# Patient Record
Sex: Female | Born: 1945 | Race: White | Hispanic: No | Smoking: Former smoker
Health system: Southern US, Community
[De-identification: ages and names within clinical notes are randomized; demographics above are authoritative.]

## PROBLEM LIST (undated history)

## (undated) DIAGNOSIS — H409 Unspecified glaucoma: Secondary | ICD-10-CM

## (undated) DIAGNOSIS — M1990 Unspecified osteoarthritis, unspecified site: Secondary | ICD-10-CM

## (undated) DIAGNOSIS — K589 Irritable bowel syndrome without diarrhea: Secondary | ICD-10-CM

## (undated) DIAGNOSIS — D649 Anemia, unspecified: Secondary | ICD-10-CM

## (undated) DIAGNOSIS — F419 Anxiety disorder, unspecified: Secondary | ICD-10-CM

## (undated) DIAGNOSIS — E785 Hyperlipidemia, unspecified: Secondary | ICD-10-CM

## (undated) DIAGNOSIS — F32A Depression, unspecified: Secondary | ICD-10-CM

## (undated) DIAGNOSIS — J189 Pneumonia, unspecified organism: Secondary | ICD-10-CM

## (undated) DIAGNOSIS — H269 Unspecified cataract: Secondary | ICD-10-CM

## (undated) DIAGNOSIS — E039 Hypothyroidism, unspecified: Secondary | ICD-10-CM

## (undated) DIAGNOSIS — I10 Essential (primary) hypertension: Secondary | ICD-10-CM

## (undated) DIAGNOSIS — K219 Gastro-esophageal reflux disease without esophagitis: Secondary | ICD-10-CM

## (undated) HISTORY — DX: Irritable bowel syndrome without diarrhea: K58.9

## (undated) HISTORY — DX: Pneumonia, unspecified organism: J18.9

## (undated) HISTORY — DX: Unspecified osteoarthritis, unspecified site: M19.90

## (undated) HISTORY — DX: Unspecified cataract: H26.9

## (undated) HISTORY — PX: APPENDECTOMY: SHX54

## (undated) HISTORY — PX: BREAST BIOPSY: SHX20

## (undated) HISTORY — PX: TONSILLECTOMY: SUR1361

## (undated) HISTORY — DX: Depression, unspecified: F32.A

## (undated) HISTORY — DX: Anemia, unspecified: D64.9

## (undated) HISTORY — DX: Gastro-esophageal reflux disease without esophagitis: K21.9

## (undated) HISTORY — DX: Hypothyroidism, unspecified: E03.9

## (undated) HISTORY — DX: Unspecified glaucoma: H40.9

## (undated) HISTORY — DX: Essential (primary) hypertension: I10

## (undated) HISTORY — DX: Anxiety disorder, unspecified: F41.9

## (undated) HISTORY — DX: Hyperlipidemia, unspecified: E78.5

---

## 1977-03-09 HISTORY — PX: TUBAL LIGATION: SHX77

## 1997-09-05 ENCOUNTER — Other Ambulatory Visit: Admission: RE | Admit: 1997-09-05 | Discharge: 1997-09-05 | Payer: Self-pay | Admitting: Obstetrics and Gynecology

## 1998-09-06 ENCOUNTER — Other Ambulatory Visit: Admission: RE | Admit: 1998-09-06 | Discharge: 1998-09-06 | Payer: Self-pay | Admitting: Obstetrics and Gynecology

## 1999-09-08 ENCOUNTER — Other Ambulatory Visit: Admission: RE | Admit: 1999-09-08 | Discharge: 1999-09-08 | Payer: Self-pay | Admitting: *Deleted

## 1999-11-21 ENCOUNTER — Encounter: Admission: RE | Admit: 1999-11-21 | Discharge: 1999-11-21 | Payer: Self-pay | Admitting: *Deleted

## 2000-03-12 ENCOUNTER — Encounter (INDEPENDENT_AMBULATORY_CARE_PROVIDER_SITE_OTHER): Payer: Self-pay | Admitting: Specialist

## 2000-03-12 ENCOUNTER — Encounter: Payer: Self-pay | Admitting: Surgery

## 2000-03-12 ENCOUNTER — Inpatient Hospital Stay (HOSPITAL_COMMUNITY): Admission: EM | Admit: 2000-03-12 | Discharge: 2000-03-14 | Payer: Self-pay | Admitting: Emergency Medicine

## 2000-09-20 ENCOUNTER — Other Ambulatory Visit: Admission: RE | Admit: 2000-09-20 | Discharge: 2000-09-20 | Payer: Self-pay | Admitting: Obstetrics and Gynecology

## 2000-11-22 ENCOUNTER — Encounter: Admission: RE | Admit: 2000-11-22 | Discharge: 2000-11-22 | Payer: Self-pay | Admitting: *Deleted

## 2001-08-24 ENCOUNTER — Ambulatory Visit (HOSPITAL_COMMUNITY): Admission: RE | Admit: 2001-08-24 | Discharge: 2001-08-24 | Payer: Self-pay | Admitting: Gastroenterology

## 2001-08-24 ENCOUNTER — Encounter (INDEPENDENT_AMBULATORY_CARE_PROVIDER_SITE_OTHER): Payer: Self-pay | Admitting: *Deleted

## 2001-09-26 ENCOUNTER — Other Ambulatory Visit: Admission: RE | Admit: 2001-09-26 | Discharge: 2001-09-26 | Payer: Self-pay | Admitting: Obstetrics and Gynecology

## 2001-11-28 ENCOUNTER — Encounter: Admission: RE | Admit: 2001-11-28 | Discharge: 2001-11-28 | Payer: Self-pay | Admitting: *Deleted

## 2002-10-03 ENCOUNTER — Other Ambulatory Visit: Admission: RE | Admit: 2002-10-03 | Discharge: 2002-10-03 | Payer: Self-pay | Admitting: Obstetrics and Gynecology

## 2002-11-30 ENCOUNTER — Encounter: Admission: RE | Admit: 2002-11-30 | Discharge: 2002-11-30 | Payer: Self-pay | Admitting: *Deleted

## 2003-12-03 ENCOUNTER — Encounter: Admission: RE | Admit: 2003-12-03 | Discharge: 2003-12-03 | Payer: Self-pay | Admitting: *Deleted

## 2004-12-04 ENCOUNTER — Encounter: Admission: RE | Admit: 2004-12-04 | Discharge: 2004-12-04 | Payer: Self-pay | Admitting: *Deleted

## 2006-01-15 ENCOUNTER — Encounter: Admission: RE | Admit: 2006-01-15 | Discharge: 2006-01-15 | Payer: Self-pay | Admitting: *Deleted

## 2006-01-26 ENCOUNTER — Encounter: Admission: RE | Admit: 2006-01-26 | Discharge: 2006-01-26 | Payer: Self-pay | Admitting: *Deleted

## 2007-01-28 ENCOUNTER — Encounter: Admission: RE | Admit: 2007-01-28 | Discharge: 2007-01-28 | Payer: Self-pay | Admitting: Obstetrics and Gynecology

## 2008-02-10 ENCOUNTER — Encounter: Admission: RE | Admit: 2008-02-10 | Discharge: 2008-02-10 | Payer: Self-pay | Admitting: Obstetrics and Gynecology

## 2009-03-14 ENCOUNTER — Encounter: Admission: RE | Admit: 2009-03-14 | Discharge: 2009-03-14 | Payer: Self-pay | Admitting: Obstetrics and Gynecology

## 2010-03-28 ENCOUNTER — Encounter
Admission: RE | Admit: 2010-03-28 | Discharge: 2010-03-28 | Payer: Self-pay | Source: Home / Self Care | Attending: Obstetrics and Gynecology | Admitting: Obstetrics and Gynecology

## 2010-04-09 ENCOUNTER — Inpatient Hospital Stay (HOSPITAL_COMMUNITY): Payer: BC Managed Care – PPO

## 2010-04-09 ENCOUNTER — Inpatient Hospital Stay (HOSPITAL_COMMUNITY)
Admission: AD | Admit: 2010-04-09 | Discharge: 2010-04-11 | DRG: 090 | Disposition: A | Discharge status: Home / Self Care | Payer: BC Managed Care – PPO | Source: Ambulatory Visit | Attending: Internal Medicine | Admitting: Internal Medicine

## 2010-04-09 DIAGNOSIS — M949 Disorder of cartilage, unspecified: Secondary | ICD-10-CM | POA: Diagnosis present

## 2010-04-09 DIAGNOSIS — F17200 Nicotine dependence, unspecified, uncomplicated: Secondary | ICD-10-CM | POA: Diagnosis present

## 2010-04-09 DIAGNOSIS — J45901 Unspecified asthma with (acute) exacerbation: Secondary | ICD-10-CM | POA: Diagnosis present

## 2010-04-09 DIAGNOSIS — J189 Pneumonia, unspecified organism: Principal | ICD-10-CM | POA: Diagnosis present

## 2010-04-09 DIAGNOSIS — M899 Disorder of bone, unspecified: Secondary | ICD-10-CM | POA: Diagnosis present

## 2010-04-09 DIAGNOSIS — E785 Hyperlipidemia, unspecified: Secondary | ICD-10-CM | POA: Diagnosis present

## 2010-04-09 LAB — BASIC METABOLIC PANEL
BUN: 9 mg/dL (ref 6–23)
CO2: 29 mEq/L (ref 19–32)
Calcium: 9.5 mg/dL (ref 8.4–10.5)
GFR calc Af Amer: >60 mL/min (ref >60)
GFR calc non Af Amer: >60 mL/min (ref >60)
Glucose, Bld: 117 mg/dL — ABNORMAL HIGH (ref 70–99)

## 2010-04-09 LAB — D-DIMER, QUANTITATIVE: D-Dimer, Quant: 1.27 ug/mL-FEU — ABNORMAL HIGH (ref 0.00–0.48)

## 2010-04-10 MED ORDER — IOHEXOL 300 MG/ML  SOLN
100.0000 mL | Freq: Once | INTRAMUSCULAR | Status: AC | PRN
  Administered 2010-04-10: 80 mL via INTRAVENOUS
  Administered 2010-04-10: 100 mL via INTRAVENOUS
  Filled 2010-04-10: qty 100

## 2010-04-12 NOTE — Discharge Summary (Signed)
Sarah Baldwin, Sarah Baldwin NO.:  [id number]  MEDICAL RECORD NO.:  [id number]           PATIENT TYPE:  I  LOCATION:  1425                         FACILITY:  [facility]  PHYSICIAN:  Geoffry Paradise, M.D.  DATE OF BIRTH:  Jun 11, 1945  DATE OF ADMISSION:  04/09/2010 DATE OF DISCHARGE:  04/11/2010                              DISCHARGE SUMMARY   SLATED DATE OF DISCHARGE:  February 3.  DIAGNOSES:  At the time of discharge: 1. Probable right lower lobe pneumonia, community-acquired. 2. Exacerbation of asthmatic bronchitis. 3. Tobacco dependency. 4. Hyperlipidemia.  HISTORY OF PRESENT ILLNESS:  Sarah Baldwin is a very pleasant 65 year old, well-known to myself, who does utilize tobacco on a daily basis and for the most part has been healthy.  The only active issue has been some hyperlipidemia.  She presented with several days of cold symptoms but suddenly awakening in the early morning hours with smothering and shortness of breath.  Subsequently, she was seen in our office where she demonstrated significant amount of air hunger and accessory muscle utilization.  Her O2 saturations at that point were in the mid 80% range.  Chest radiograph was basically unrevealing, but exam was very suggestive of a right lower lobe pneumonia clinically and this in conjunction with her hypoxemia and work of breathing led to admission for further management as she was not deemed to be safe in an outpatient setting.  ALLERGIES:  No known drug allergies.  MEDICATIONS:  Upon admission included: 1. Pravastatin 20 mg daily. 2. Fish oil 1000 b.i.d.  MEDICAL ILLNESSES:  Included: 1. Hyperlipidemia. 2. Osteopenia. 3. Acute bronchitis. 4. Sinusitis. 5. Tobacco utilization. 6. Granulomatous colitis.  SURGICAL ILLNESSES:  Include: 1. Appendectomy in 2002. 2. Tubal ligation in 1979.  FAMILY HISTORY:  Remarkable for cerebrovascular disease, diabetes, hypertension.  SOCIAL HISTORY:  The  patient is widowed, retired Dipl[employer] Services operational officer.  Smokes less than a pack of cigarettes per day.  Rare alcohol.  PHYSICAL EXAMINATION:  VITAL SIGNS:  Upon admission, temperature was 101.7, blood pressure was 152/105, pulse was 130 and regular, O2 saturation was 89% room air.  Weight was 119 pounds. GENERAL:  The patient was toxic appearing, dyspneic. HEENT:  Good facial symmetry.  Anicteric.  Oropharynx is benign. NECK:  No JVD or bruits. LUNGS:  Prolonged expiratory phase.  Mild wheezing bilaterally.  Right lower lung field rales one-third the way up, left otherwise clear. CARDIOVASCULAR:  Tachycardic.  No murmur, gallop, rub or heave. ABDOMEN:  Soft, nontender.  Bowel sounds normal. EXTREMITIES:  No cyanosis, clubbing or edema.  No mottling.  Warm distally.  Intact pulses. NEUROLOGIC:  She was nonlateralizing.  DIAGNOSTIC STUDIES:  Chest x-ray initially was negative, this was done in our office setting.  CT angio done revealed no pulmonary embolus, basilar subsegmental vessels not evaluated because of respiratory motion emphysema, small amount of anterior pericardial fluid or thickening.  LABORATORY DATA:  Laboratory work attached to the chart and done in our office.  CBC; hemoglobin 15.8, hematocrit 48.9, white blood count 6.3, platelet count 208,000.  Chemistry; sodium 105, BUN 12, creatinine 0.7, GFR was 90.1, sodium 138, potassium 4.1, chloride 104, CO2 was 27.  Calcium was 10.1, protein 7.7, albumin 4.1, SGOT 21, SGPT 23, alk phos 96, bilirubin was 0.2.  D-dimer was high at 1.27.  BMET; sodium 140, potassium 4.3, chloride 102, CO2 was 29, glucose 117, BUN is 9, creatinine 0.7.  HOSPITAL COURSE:  The patient was admitted, placed on IV fluids.  She was started on Rocephin IV and Zithromax 500 daily.  We subsequently added Solu-Medrol to address her bronchospasm and NicoDerm patch to help with nicotine wean.  She improved fairly dramatically to the extent that respiratory status on the  morning of this dictation is back to baseline. She is afebrile and blood pressure has remained stable following settling down of her work of breathing with the systolics ranging 123- 135 and diastolics in the 70 range.  Her O2 saturations have been in the 97% range on 2 L, and oxygen has been weaned.  She has been ambulating without difficulty and transitioned to oral medications at the time of this discharge.  Should she continue to improve through the day, she will be discharged for further as an outpatient.  Despite the negative radiographic findings, clinically this still remains compatible with a right lower lobe pneumonia.  We will not repeat to reconfirm following hydration and just treat empirically.  DISCHARGE MEDICATIONS:  Include: 1. Acetaminophen 325, 650 every 4 hours p.r.n. 2. NicoDerm patch 14 mg per 24 hours applied topically daily for 1     month. 3. Avelox 400 mg daily for 7 days. 4. ProAir Inhaler 2 puffs every 4 hours as needed for wheezing or     shortness of breath. 5. Continuation of her home fish oral, vitamin D and Pravachol. 6. Additionally, she is on a prednisone taper 10 mg q.i.d. 3 days,     t.i.d. 3 days, b.i.d. 3 days, daily 3 days, then discontinuation.  She will follow up with Dr. Jacky Kindle in approximately 7-10 days.  She was counseled and educated on strict tobacco abstinence.  She is tolerating a regular diet and has no special needs.  She will be handled by her daughter, who is coming to town and spending the weekend and with her.          ______________________________ Geoffry Paradise, M.D.     RA/MEDQ  D:  04/11/2010  T:  04/11/2010  Job:  [id number]  Electronically Signed by Geoffry Paradise M.D. on 04/12/2010 01:15:09 PM

## 2010-07-25 NOTE — H&P (Signed)
[facility]  Patient:    Sarah Baldwin, Sarah Baldwin                MRN: [id number] Adm. Date:  03/12/00 Attending:  Velora Heckler, M.D. CC:         Richard A. Jacky Kindle, M.D.   History and Physical  ADMISSION DIAGNOSIS:  Acute appendicitis.  BRIEF HISTORY:  The patient is a 65 year old white female who presents to the emergency department with a 48-hour history of generalized abdominal discomfort which is now localized to the right lower quadrant.  The patient initially had crampy abdominal pain.  She now has more persistent pain in the right lower quadrant.  She is unable to stand erect.  She has pain with walking and with riding in a car.  She has had generalized loss of appetite and has only taken a little bit of fluid and two soda crackers today.  The patient was seen by her primary care physician, Richard A. Jacky Kindle, M.D., and referred to the emergency department with a presumptive diagnosis of acute appendicitis.  The patient was seen and evaluated in the emergency department. She was noted to have an elevated white blood cell count of 11,000 with a slight left shift.  She is afebrile.  Abdominal x-ray was unrevealing.  Other laboratory studies and urinalysis were benign.  PAST MEDICAL HISTORY:  Status post bilateral tubal ligation in 1980.  MEDICATIONS:  Prempro.  ALLERGIES:  None known.  SOCIAL HISTORY:  The patient is the Biochemist, clinical at [employer].  She smokes less than a pack of cigarettes a day.  She drinks alcohol on occasion.  REVIEW OF SYSTEMS:  The patient has had a recent history of cardiac palpitations.  A complete work-up has not been obtained.  Otherwise the review of systems is unremarkable.  PHYSICAL EXAMINATION:  GENERAL APPEARANCE:  A 65 year old, well-nourished, well-developed, white female on a stretcher in the emergency department.  VITAL SIGNS:  Temperature 97.6 degrees, pulse 91,  respirations 24, blood pressure 121/80.  HEENT:  Normocephalic.  The sclerae are clear.  The mucous membranes are moist.  Dentition is good.  NECK:  Supple without masses.  CHEST:  Clear to auscultation bilaterally.  There is no costovertebral angle tenderness.  CARDIAC:  Regular rate and rhythm.  ABDOMEN:  Soft.  There are bowel sounds present.  There is a surgical wound at the umbilicus.  There is mild tenderness to percussion in the suprapubic area. There is moderate tenderness to percussion in the right lower quadrant at approximately McBurneys point.  There is tenderness to palpation in the right lower quadrant at McBurneys point.  There is rebound tenderness in the right lower quadrant.  There is referred rebound tenderness in the left lower quadrant.  EXTREMITIES:  Nontender without edema.  NEUROLOGIC:  The patient is alert and oriented to person, place, and time without focal neurologic deficits.  LABORATORY STUDIES:  The complete blood count shows a white blood cell count of 11,000, the hemoglobin is 14.8, hematocrit 42.4%, platelet count 225,000, and the differential shows 75% neutrophils and 18% lymphocytes.  The urinalysis is benign.  Her pro time is normal at 12.8 with an INR of 1.0.  The chemistry profile is pending at the time of dictation.  Abdominal x-ray with no acute intra-abdominal findings.  EKG with normal sinus rhythm without acute findings.  Chest x-ray with prominent right epicardial fat pad, otherwise no active disease.  IMPRESSION:  Probable acute appendicitis.  I  discussed at length with the patient and her friend the indications for surgery.  I offered diagnostic laparoscopy and possible laparoscopic appendectomy versus observation and CT scan of the abdomen.  The patient would prefer the surgical route.  I quoted them approximately an 80% chance that the appendix could be removed laparoscopically and an approximately 20% chance of conversion to  an open procedure.  The understand and wish to proceed.  PLAN: 1. Admit to [facility]. 2. Initiation of intravenous antibiotics. 3. To operating room for appendectomy. 4. Routine postoperative care. DD:  03/12/00 TD:  03/12/00 Job: [id number]

## 2010-07-25 NOTE — Op Note (Signed)
[facility]  Patient:    Sarah Baldwin, Sarah Baldwin                 MRN: [id number] Proc. Date: 03/12/00 Adm. Date:  [id number] Attending:  Bonnetta Barry CC:         Richard A. Jacky Kindle, M.D.   Operative Report  PREOPERATIVE DIAGNOSIS:  Acute appendicitis.  POSTOPERATIVE DIAGNOSIS:  Acute appendicitis.  OPERATION:  Laparoscopic appendectomy.  SURGEON:  Velora Heckler, M.D.  ANESTHESIA:  General.  ESTIMATED BLOOD LOSS:  Minimal.  PREPARATION:  Betadine.  COMPLICATIONS:  None.  INDICATIONS:  The patient is a 65 year old white female who presents to the emergency room with a 48-hour history of abdominal pain, loss of appetite, and pain localized to the right lower quadrant.  Laboratory studies show an elevated white blood cell count of 11,000 with 75% segmented neutrophil. Physical examination is consistent with acute appendicitis.  The patient was brought to the operating room for diagnostic laparoscopy and laparoscopic appendectomy.  DESCRIPTION OF PROCEDURE:  The procedure is done in OR #1 at the [facility]  The patient is brought to the operating room and placed in the supine position on the operating room table.  Following administration of general endotracheal anesthetic, the patient was prepped and draped in the usual strict aseptic fashion.  After ascertaining an adequate level of anesthesia had been obtained, an infraumbilical incision was made with a #15 blade.  Dissection was carried down to subcutaneous tissues.  The fascia was incised in the midline and the peritoneal cavity was entered cautiously.  An 0 Vicryl pursestring suture was placed in the fascia.  A Hasson cannula was introduced under direct vision and secured with a pursestring suture.  The abdomen was insufflated with carbon dioxide.  The laparoscope was introduced under direct vision, and the abdomen explored.  There are adhesions of the omentum to  the right lower quadrant.  The cecum and base of the appendix are visible.  Operative ports were placed in the right upper quadrant and left lower quadrant.  The base of the appendix was elevated and a window is created.  The base of the appendix was transected with a GIA type stapler. The appendix is acutely inflamed distally with omental adhesions densely wrapped around the tip of the appendix.  Gentle dissection with instruments allows for separation and the appendiceal mesentery is identified.  This is transected with a GIA type stapler.  The remaining adhesions to the tip of the appendix were taken down with the electrocautery and the Metzenbaum scissors.  The appendix is completely mobilized and placed into an EndoCatch bag and withdrawn through the left lower quadrant port without difficulty.  It is submitted to pathology for review.  The right lower quadrant is copiously irrigated with warm saline.  Good hemostasis is noted.  Fluid is evacuated. Ports are removed under direct vision and pneumoperitoneum released.  The 0 Vicryl pursestring suture is tied securely.  Port sites are anesthetized with local anesthetic.  All three wounds are closed with interrupted 4-0 Vicryl and subcuticular sutures.  The wounds are washed and dried and benzoin and Steri-Strips were applied.  A sterile gauze dressings are applied.  The patient is transferred to the recovery room in stable condition.  The patient tolerated the procedure well. DD:  03/12/00 TD:  03/13/00 Job: [id number]

## 2011-03-12 ENCOUNTER — Other Ambulatory Visit: Payer: Self-pay | Admitting: Internal Medicine

## 2011-03-12 DIAGNOSIS — Z1231 Encounter for screening mammogram for malignant neoplasm of breast: Secondary | ICD-10-CM

## 2011-03-31 ENCOUNTER — Ambulatory Visit: Payer: BC Managed Care – PPO

## 2011-04-14 ENCOUNTER — Ambulatory Visit
Admission: RE | Admit: 2011-04-14 | Discharge: 2011-04-14 | Disposition: A | Discharge status: Home / Self Care | Payer: BC Managed Care – PPO | Source: Ambulatory Visit | Attending: Internal Medicine | Admitting: Internal Medicine

## 2011-04-14 DIAGNOSIS — Z1231 Encounter for screening mammogram for malignant neoplasm of breast: Secondary | ICD-10-CM

## 2013-01-11 ENCOUNTER — Ambulatory Visit: Payer: BC Managed Care – PPO

## 2013-04-05 ENCOUNTER — Other Ambulatory Visit: Payer: Self-pay

## 2013-04-05 DIAGNOSIS — Z1231 Encounter for screening mammogram for malignant neoplasm of breast: Secondary | ICD-10-CM

## 2013-04-19 ENCOUNTER — Ambulatory Visit
Admission: RE | Admit: 2013-04-19 | Discharge: 2013-04-19 | Disposition: A | Discharge status: Home / Self Care | Payer: Medicare Other | Source: Ambulatory Visit

## 2013-04-19 DIAGNOSIS — Z1231 Encounter for screening mammogram for malignant neoplasm of breast: Secondary | ICD-10-CM

## 2013-04-21 ENCOUNTER — Other Ambulatory Visit: Payer: Self-pay | Admitting: Internal Medicine

## 2013-04-21 DIAGNOSIS — R928 Other abnormal and inconclusive findings on diagnostic imaging of breast: Secondary | ICD-10-CM

## 2013-05-09 ENCOUNTER — Other Ambulatory Visit: Payer: Self-pay | Admitting: Internal Medicine

## 2013-05-09 ENCOUNTER — Ambulatory Visit
Admission: RE | Admit: 2013-05-09 | Discharge: 2013-05-09 | Disposition: A | Discharge status: Home / Self Care | Payer: Medicare Other | Source: Ambulatory Visit | Attending: Internal Medicine | Admitting: Internal Medicine

## 2013-05-09 DIAGNOSIS — R928 Other abnormal and inconclusive findings on diagnostic imaging of breast: Secondary | ICD-10-CM

## 2013-05-18 ENCOUNTER — Ambulatory Visit
Admission: RE | Admit: 2013-05-18 | Discharge: 2013-05-18 | Disposition: A | Discharge status: Home / Self Care | Payer: Medicare Other | Source: Ambulatory Visit | Attending: Internal Medicine | Admitting: Internal Medicine

## 2013-05-18 ENCOUNTER — Other Ambulatory Visit: Payer: Self-pay | Admitting: Internal Medicine

## 2013-05-18 DIAGNOSIS — R928 Other abnormal and inconclusive findings on diagnostic imaging of breast: Secondary | ICD-10-CM

## 2013-05-18 HISTORY — PX: BREAST BIOPSY: SHX20

## 2013-10-23 ENCOUNTER — Other Ambulatory Visit: Payer: Self-pay | Admitting: Internal Medicine

## 2013-10-23 DIAGNOSIS — N63 Unspecified lump in unspecified breast: Secondary | ICD-10-CM

## 2013-11-21 ENCOUNTER — Ambulatory Visit
Admission: RE | Admit: 2013-11-21 | Discharge: 2013-11-21 | Disposition: A | Discharge status: Home / Self Care | Payer: Medicare Other | Source: Ambulatory Visit | Attending: Internal Medicine | Admitting: Internal Medicine

## 2013-11-21 ENCOUNTER — Encounter (INDEPENDENT_AMBULATORY_CARE_PROVIDER_SITE_OTHER): Payer: Self-pay

## 2013-11-21 DIAGNOSIS — N63 Unspecified lump in unspecified breast: Secondary | ICD-10-CM

## 2014-06-01 ENCOUNTER — Other Ambulatory Visit: Payer: Self-pay

## 2014-06-01 DIAGNOSIS — Z1231 Encounter for screening mammogram for malignant neoplasm of breast: Secondary | ICD-10-CM

## 2014-06-11 ENCOUNTER — Ambulatory Visit
Admission: RE | Admit: 2014-06-11 | Discharge: 2014-06-11 | Disposition: A | Discharge status: Home / Self Care | Payer: Self-pay | Source: Ambulatory Visit

## 2014-06-11 DIAGNOSIS — Z1231 Encounter for screening mammogram for malignant neoplasm of breast: Secondary | ICD-10-CM

## 2015-03-10 HISTORY — PX: RETINAL DETACHMENT SURGERY: SHX105

## 2015-03-10 HISTORY — PX: CATARACT EXTRACTION, BILATERAL: SHX1313

## 2015-06-25 ENCOUNTER — Other Ambulatory Visit: Payer: Self-pay

## 2015-06-25 DIAGNOSIS — Z1231 Encounter for screening mammogram for malignant neoplasm of breast: Secondary | ICD-10-CM

## 2015-07-10 ENCOUNTER — Ambulatory Visit
Admission: RE | Admit: 2015-07-10 | Discharge: 2015-07-10 | Disposition: A | Discharge status: Home / Self Care | Payer: Medicare Other | Source: Ambulatory Visit

## 2015-07-10 DIAGNOSIS — Z1231 Encounter for screening mammogram for malignant neoplasm of breast: Secondary | ICD-10-CM

## 2016-08-12 ENCOUNTER — Other Ambulatory Visit: Payer: Self-pay | Admitting: Internal Medicine

## 2016-08-12 DIAGNOSIS — Z1231 Encounter for screening mammogram for malignant neoplasm of breast: Secondary | ICD-10-CM

## 2016-09-02 ENCOUNTER — Ambulatory Visit
Admission: RE | Admit: 2016-09-02 | Discharge: 2016-09-02 | Disposition: A | Discharge status: Home / Self Care | Payer: Medicare Other | Source: Ambulatory Visit | Attending: Internal Medicine | Admitting: Internal Medicine

## 2016-09-02 ENCOUNTER — Other Ambulatory Visit: Payer: Self-pay | Admitting: Internal Medicine

## 2016-09-02 DIAGNOSIS — Z1231 Encounter for screening mammogram for malignant neoplasm of breast: Secondary | ICD-10-CM

## 2017-07-30 ENCOUNTER — Other Ambulatory Visit: Payer: Self-pay | Admitting: Internal Medicine

## 2017-07-30 DIAGNOSIS — Z1231 Encounter for screening mammogram for malignant neoplasm of breast: Secondary | ICD-10-CM

## 2017-09-03 ENCOUNTER — Ambulatory Visit
Admission: RE | Admit: 2017-09-03 | Discharge: 2017-09-03 | Disposition: A | Discharge status: Home / Self Care | Payer: Medicare Other | Source: Ambulatory Visit | Attending: Internal Medicine | Admitting: Internal Medicine

## 2017-09-03 DIAGNOSIS — Z1231 Encounter for screening mammogram for malignant neoplasm of breast: Secondary | ICD-10-CM

## 2018-08-19 ENCOUNTER — Other Ambulatory Visit: Payer: Self-pay | Admitting: Internal Medicine

## 2018-08-19 DIAGNOSIS — Z1231 Encounter for screening mammogram for malignant neoplasm of breast: Secondary | ICD-10-CM

## 2018-10-19 ENCOUNTER — Ambulatory Visit
Admission: RE | Admit: 2018-10-19 | Discharge: 2018-10-19 | Disposition: A | Discharge status: Home / Self Care | Payer: Medicare Other | Source: Ambulatory Visit | Attending: Internal Medicine | Admitting: Internal Medicine

## 2018-10-19 ENCOUNTER — Other Ambulatory Visit: Payer: Self-pay

## 2018-10-19 DIAGNOSIS — Z1231 Encounter for screening mammogram for malignant neoplasm of breast: Secondary | ICD-10-CM

## 2019-05-08 DIAGNOSIS — F4321 Adjustment disorder with depressed mood: Secondary | ICD-10-CM | POA: Diagnosis not present

## 2019-07-10 DIAGNOSIS — F4321 Adjustment disorder with depressed mood: Secondary | ICD-10-CM | POA: Diagnosis not present

## 2019-07-21 DIAGNOSIS — H401131 Primary open-angle glaucoma, bilateral, mild stage: Secondary | ICD-10-CM | POA: Diagnosis not present

## 2019-07-21 DIAGNOSIS — H26493 Other secondary cataract, bilateral: Secondary | ICD-10-CM | POA: Diagnosis not present

## 2019-08-28 DIAGNOSIS — L570 Actinic keratosis: Secondary | ICD-10-CM | POA: Diagnosis not present

## 2019-09-08 DIAGNOSIS — R21 Rash and other nonspecific skin eruption: Secondary | ICD-10-CM | POA: Diagnosis not present

## 2019-09-08 DIAGNOSIS — W57XXXA Bitten or stung by nonvenomous insect and other nonvenomous arthropods, initial encounter: Secondary | ICD-10-CM | POA: Diagnosis not present

## 2019-09-08 DIAGNOSIS — S70362A Insect bite (nonvenomous), left thigh, initial encounter: Secondary | ICD-10-CM | POA: Diagnosis not present

## 2019-10-04 DIAGNOSIS — Z0000 Encounter for general adult medical examination without abnormal findings: Secondary | ICD-10-CM | POA: Diagnosis not present

## 2019-10-04 DIAGNOSIS — M859 Disorder of bone density and structure, unspecified: Secondary | ICD-10-CM | POA: Diagnosis not present

## 2019-10-04 DIAGNOSIS — E7849 Other hyperlipidemia: Secondary | ICD-10-CM | POA: Diagnosis not present

## 2019-10-04 DIAGNOSIS — E039 Hypothyroidism, unspecified: Secondary | ICD-10-CM | POA: Diagnosis not present

## 2019-10-11 DIAGNOSIS — R82998 Other abnormal findings in urine: Secondary | ICD-10-CM | POA: Diagnosis not present

## 2019-10-11 DIAGNOSIS — H814 Vertigo of central origin: Secondary | ICD-10-CM | POA: Diagnosis not present

## 2019-10-11 DIAGNOSIS — E785 Hyperlipidemia, unspecified: Secondary | ICD-10-CM | POA: Diagnosis not present

## 2019-10-11 DIAGNOSIS — F4321 Adjustment disorder with depressed mood: Secondary | ICD-10-CM | POA: Diagnosis not present

## 2019-10-11 DIAGNOSIS — M8580 Other specified disorders of bone density and structure, unspecified site: Secondary | ICD-10-CM | POA: Diagnosis not present

## 2019-10-11 DIAGNOSIS — I10 Essential (primary) hypertension: Secondary | ICD-10-CM | POA: Diagnosis not present

## 2019-10-11 DIAGNOSIS — Z0000 Encounter for general adult medical examination without abnormal findings: Secondary | ICD-10-CM | POA: Diagnosis not present

## 2019-10-11 DIAGNOSIS — E039 Hypothyroidism, unspecified: Secondary | ICD-10-CM | POA: Diagnosis not present

## 2019-10-11 DIAGNOSIS — H3321 Serous retinal detachment, right eye: Secondary | ICD-10-CM | POA: Diagnosis not present

## 2019-10-11 DIAGNOSIS — K219 Gastro-esophageal reflux disease without esophagitis: Secondary | ICD-10-CM | POA: Diagnosis not present

## 2019-10-12 ENCOUNTER — Other Ambulatory Visit: Payer: Self-pay | Admitting: Internal Medicine

## 2019-10-12 DIAGNOSIS — Z1231 Encounter for screening mammogram for malignant neoplasm of breast: Secondary | ICD-10-CM

## 2019-10-23 ENCOUNTER — Ambulatory Visit
Admission: RE | Admit: 2019-10-23 | Discharge: 2019-10-23 | Disposition: A | Discharge status: Home / Self Care | Payer: Medicare Other | Source: Ambulatory Visit | Attending: Internal Medicine | Admitting: Internal Medicine

## 2019-10-23 ENCOUNTER — Other Ambulatory Visit: Payer: Self-pay

## 2019-10-23 DIAGNOSIS — Z1231 Encounter for screening mammogram for malignant neoplasm of breast: Secondary | ICD-10-CM

## 2019-11-10 LAB — LAB REPORT - SCANNED: Albumin, Urine POC: 5

## 2020-01-16 DIAGNOSIS — H524 Presbyopia: Secondary | ICD-10-CM | POA: Diagnosis not present

## 2020-01-16 DIAGNOSIS — H26493 Other secondary cataract, bilateral: Secondary | ICD-10-CM | POA: Diagnosis not present

## 2020-01-16 DIAGNOSIS — H401131 Primary open-angle glaucoma, bilateral, mild stage: Secondary | ICD-10-CM | POA: Diagnosis not present

## 2020-01-16 DIAGNOSIS — H35373 Puckering of macula, bilateral: Secondary | ICD-10-CM | POA: Diagnosis not present

## 2020-02-19 DIAGNOSIS — H6121 Impacted cerumen, right ear: Secondary | ICD-10-CM | POA: Diagnosis not present

## 2020-03-14 DIAGNOSIS — H26491 Other secondary cataract, right eye: Secondary | ICD-10-CM | POA: Diagnosis not present

## 2020-04-22 DIAGNOSIS — E785 Hyperlipidemia, unspecified: Secondary | ICD-10-CM | POA: Diagnosis not present

## 2020-04-22 DIAGNOSIS — H3321 Serous retinal detachment, right eye: Secondary | ICD-10-CM | POA: Diagnosis not present

## 2020-04-22 DIAGNOSIS — T466X5A Adverse effect of antihyperlipidemic and antiarteriosclerotic drugs, initial encounter: Secondary | ICD-10-CM | POA: Diagnosis not present

## 2020-04-22 DIAGNOSIS — K219 Gastro-esophageal reflux disease without esophagitis: Secondary | ICD-10-CM | POA: Diagnosis not present

## 2020-04-22 DIAGNOSIS — I10 Essential (primary) hypertension: Secondary | ICD-10-CM | POA: Diagnosis not present

## 2020-04-22 DIAGNOSIS — F4321 Adjustment disorder with depressed mood: Secondary | ICD-10-CM | POA: Diagnosis not present

## 2020-04-22 DIAGNOSIS — E039 Hypothyroidism, unspecified: Secondary | ICD-10-CM | POA: Diagnosis not present

## 2020-04-23 DIAGNOSIS — D485 Neoplasm of uncertain behavior of skin: Secondary | ICD-10-CM | POA: Diagnosis not present

## 2020-04-23 DIAGNOSIS — L820 Inflamed seborrheic keratosis: Secondary | ICD-10-CM | POA: Diagnosis not present

## 2020-08-07 DIAGNOSIS — H26492 Other secondary cataract, left eye: Secondary | ICD-10-CM | POA: Diagnosis not present

## 2020-08-07 DIAGNOSIS — H401131 Primary open-angle glaucoma, bilateral, mild stage: Secondary | ICD-10-CM | POA: Diagnosis not present

## 2020-08-20 DIAGNOSIS — H8111 Benign paroxysmal vertigo, right ear: Secondary | ICD-10-CM | POA: Diagnosis not present

## 2020-08-20 DIAGNOSIS — H6123 Impacted cerumen, bilateral: Secondary | ICD-10-CM | POA: Diagnosis not present

## 2020-08-27 DIAGNOSIS — L570 Actinic keratosis: Secondary | ICD-10-CM | POA: Diagnosis not present

## 2020-09-10 DIAGNOSIS — H26492 Other secondary cataract, left eye: Secondary | ICD-10-CM | POA: Diagnosis not present

## 2020-10-07 ENCOUNTER — Other Ambulatory Visit: Payer: Self-pay | Admitting: Internal Medicine

## 2020-10-07 DIAGNOSIS — Z1231 Encounter for screening mammogram for malignant neoplasm of breast: Secondary | ICD-10-CM

## 2020-10-14 DIAGNOSIS — M859 Disorder of bone density and structure, unspecified: Secondary | ICD-10-CM | POA: Diagnosis not present

## 2020-10-14 DIAGNOSIS — E785 Hyperlipidemia, unspecified: Secondary | ICD-10-CM | POA: Diagnosis not present

## 2020-10-14 DIAGNOSIS — E039 Hypothyroidism, unspecified: Secondary | ICD-10-CM | POA: Diagnosis not present

## 2020-10-21 DIAGNOSIS — T466X5A Adverse effect of antihyperlipidemic and antiarteriosclerotic drugs, initial encounter: Secondary | ICD-10-CM | POA: Diagnosis not present

## 2020-10-21 DIAGNOSIS — Z1331 Encounter for screening for depression: Secondary | ICD-10-CM | POA: Diagnosis not present

## 2020-10-21 DIAGNOSIS — Z1389 Encounter for screening for other disorder: Secondary | ICD-10-CM | POA: Diagnosis not present

## 2020-10-21 DIAGNOSIS — I10 Essential (primary) hypertension: Secondary | ICD-10-CM | POA: Diagnosis not present

## 2020-10-21 DIAGNOSIS — F4321 Adjustment disorder with depressed mood: Secondary | ICD-10-CM | POA: Diagnosis not present

## 2020-10-21 DIAGNOSIS — Z0000 Encounter for general adult medical examination without abnormal findings: Secondary | ICD-10-CM | POA: Diagnosis not present

## 2020-10-21 DIAGNOSIS — E039 Hypothyroidism, unspecified: Secondary | ICD-10-CM | POA: Diagnosis not present

## 2020-10-21 DIAGNOSIS — R82998 Other abnormal findings in urine: Secondary | ICD-10-CM | POA: Diagnosis not present

## 2020-10-21 DIAGNOSIS — H8111 Benign paroxysmal vertigo, right ear: Secondary | ICD-10-CM | POA: Diagnosis not present

## 2020-10-21 DIAGNOSIS — E785 Hyperlipidemia, unspecified: Secondary | ICD-10-CM | POA: Diagnosis not present

## 2020-10-29 DIAGNOSIS — H8111 Benign paroxysmal vertigo, right ear: Secondary | ICD-10-CM | POA: Diagnosis not present

## 2020-10-29 DIAGNOSIS — R42 Dizziness and giddiness: Secondary | ICD-10-CM | POA: Diagnosis not present

## 2020-10-29 DIAGNOSIS — H81391 Other peripheral vertigo, right ear: Secondary | ICD-10-CM | POA: Diagnosis not present

## 2020-11-01 DIAGNOSIS — H8111 Benign paroxysmal vertigo, right ear: Secondary | ICD-10-CM | POA: Diagnosis not present

## 2020-11-01 DIAGNOSIS — R42 Dizziness and giddiness: Secondary | ICD-10-CM | POA: Diagnosis not present

## 2020-11-01 DIAGNOSIS — H81391 Other peripheral vertigo, right ear: Secondary | ICD-10-CM | POA: Diagnosis not present

## 2020-11-05 DIAGNOSIS — R42 Dizziness and giddiness: Secondary | ICD-10-CM | POA: Diagnosis not present

## 2020-11-05 DIAGNOSIS — H8111 Benign paroxysmal vertigo, right ear: Secondary | ICD-10-CM | POA: Diagnosis not present

## 2020-11-05 DIAGNOSIS — H81391 Other peripheral vertigo, right ear: Secondary | ICD-10-CM | POA: Diagnosis not present

## 2020-11-26 ENCOUNTER — Other Ambulatory Visit: Payer: Self-pay

## 2020-11-26 ENCOUNTER — Ambulatory Visit
Admission: RE | Admit: 2020-11-26 | Discharge: 2020-11-26 | Disposition: A | Discharge status: Home / Self Care | Payer: Medicare PPO | Source: Ambulatory Visit | Attending: Internal Medicine | Admitting: Internal Medicine

## 2020-11-26 DIAGNOSIS — Z1231 Encounter for screening mammogram for malignant neoplasm of breast: Secondary | ICD-10-CM | POA: Diagnosis not present

## 2021-02-17 DIAGNOSIS — H6122 Impacted cerumen, left ear: Secondary | ICD-10-CM | POA: Diagnosis not present

## 2021-03-18 DIAGNOSIS — H401131 Primary open-angle glaucoma, bilateral, mild stage: Secondary | ICD-10-CM | POA: Diagnosis not present

## 2021-03-18 DIAGNOSIS — H31003 Unspecified chorioretinal scars, bilateral: Secondary | ICD-10-CM | POA: Diagnosis not present

## 2021-03-18 DIAGNOSIS — H35373 Puckering of macula, bilateral: Secondary | ICD-10-CM | POA: Diagnosis not present

## 2021-03-18 DIAGNOSIS — H52203 Unspecified astigmatism, bilateral: Secondary | ICD-10-CM | POA: Diagnosis not present

## 2021-07-01 DIAGNOSIS — Z01411 Encounter for gynecological examination (general) (routine) with abnormal findings: Secondary | ICD-10-CM | POA: Diagnosis not present

## 2021-07-01 DIAGNOSIS — Z1151 Encounter for screening for human papillomavirus (HPV): Secondary | ICD-10-CM | POA: Diagnosis not present

## 2021-07-01 DIAGNOSIS — Z124 Encounter for screening for malignant neoplasm of cervix: Secondary | ICD-10-CM | POA: Diagnosis not present

## 2021-09-11 DIAGNOSIS — L570 Actinic keratosis: Secondary | ICD-10-CM | POA: Diagnosis not present

## 2021-09-17 DIAGNOSIS — H401131 Primary open-angle glaucoma, bilateral, mild stage: Secondary | ICD-10-CM | POA: Diagnosis not present

## 2021-11-17 ENCOUNTER — Other Ambulatory Visit: Payer: Self-pay | Admitting: Internal Medicine

## 2021-11-17 DIAGNOSIS — Z1231 Encounter for screening mammogram for malignant neoplasm of breast: Secondary | ICD-10-CM

## 2021-12-04 ENCOUNTER — Ambulatory Visit
Admission: RE | Admit: 2021-12-04 | Discharge: 2021-12-04 | Disposition: A | Discharge status: Home / Self Care | Payer: Medicare PPO | Source: Ambulatory Visit | Attending: Internal Medicine | Admitting: Internal Medicine

## 2021-12-04 DIAGNOSIS — Z1231 Encounter for screening mammogram for malignant neoplasm of breast: Secondary | ICD-10-CM | POA: Diagnosis not present

## 2021-12-29 DIAGNOSIS — E039 Hypothyroidism, unspecified: Secondary | ICD-10-CM | POA: Diagnosis not present

## 2021-12-29 DIAGNOSIS — E785 Hyperlipidemia, unspecified: Secondary | ICD-10-CM | POA: Diagnosis not present

## 2021-12-29 DIAGNOSIS — R7989 Other specified abnormal findings of blood chemistry: Secondary | ICD-10-CM | POA: Diagnosis not present

## 2021-12-29 DIAGNOSIS — R413 Other amnesia: Secondary | ICD-10-CM | POA: Diagnosis not present

## 2021-12-29 DIAGNOSIS — I10 Essential (primary) hypertension: Secondary | ICD-10-CM | POA: Diagnosis not present

## 2021-12-31 ENCOUNTER — Other Ambulatory Visit: Payer: Self-pay | Admitting: Internal Medicine

## 2021-12-31 ENCOUNTER — Other Ambulatory Visit (HOSPITAL_COMMUNITY): Payer: Self-pay | Admitting: Internal Medicine

## 2021-12-31 DIAGNOSIS — E785 Hyperlipidemia, unspecified: Secondary | ICD-10-CM | POA: Diagnosis not present

## 2021-12-31 DIAGNOSIS — H814 Vertigo of central origin: Secondary | ICD-10-CM | POA: Diagnosis not present

## 2021-12-31 DIAGNOSIS — R82998 Other abnormal findings in urine: Secondary | ICD-10-CM | POA: Diagnosis not present

## 2021-12-31 DIAGNOSIS — Z1331 Encounter for screening for depression: Secondary | ICD-10-CM | POA: Diagnosis not present

## 2021-12-31 DIAGNOSIS — Z0000 Encounter for general adult medical examination without abnormal findings: Secondary | ICD-10-CM | POA: Diagnosis not present

## 2021-12-31 DIAGNOSIS — I10 Essential (primary) hypertension: Secondary | ICD-10-CM | POA: Diagnosis not present

## 2021-12-31 DIAGNOSIS — R413 Other amnesia: Secondary | ICD-10-CM | POA: Diagnosis not present

## 2021-12-31 DIAGNOSIS — E039 Hypothyroidism, unspecified: Secondary | ICD-10-CM | POA: Diagnosis not present

## 2021-12-31 DIAGNOSIS — Z1339 Encounter for screening examination for other mental health and behavioral disorders: Secondary | ICD-10-CM | POA: Diagnosis not present

## 2022-01-01 ENCOUNTER — Ambulatory Visit (HOSPITAL_COMMUNITY)
Admission: RE | Admit: 2022-01-01 | Discharge: 2022-01-01 | Disposition: A | Discharge status: Home / Self Care | Payer: Medicare PPO | Source: Ambulatory Visit | Attending: Internal Medicine | Admitting: Internal Medicine

## 2022-01-01 ENCOUNTER — Other Ambulatory Visit (HOSPITAL_COMMUNITY): Payer: Self-pay | Admitting: Internal Medicine

## 2022-01-01 ENCOUNTER — Encounter (HOSPITAL_COMMUNITY): Payer: Self-pay

## 2022-01-01 DIAGNOSIS — R413 Other amnesia: Secondary | ICD-10-CM

## 2022-01-01 MED ORDER — IOHEXOL 300 MG/ML  SOLN
80.0000 mL | Freq: Once | INTRAMUSCULAR | Status: AC | PRN
  Administered 2022-01-01: 80 mL via INTRAVENOUS

## 2022-01-05 DIAGNOSIS — D485 Neoplasm of uncertain behavior of skin: Secondary | ICD-10-CM | POA: Diagnosis not present

## 2022-01-05 DIAGNOSIS — L820 Inflamed seborrheic keratosis: Secondary | ICD-10-CM | POA: Diagnosis not present

## 2022-01-06 DIAGNOSIS — G43909 Migraine, unspecified, not intractable, without status migrainosus: Secondary | ICD-10-CM | POA: Diagnosis not present

## 2022-01-06 DIAGNOSIS — H31002 Unspecified chorioretinal scars, left eye: Secondary | ICD-10-CM | POA: Diagnosis not present

## 2022-03-30 DIAGNOSIS — H52203 Unspecified astigmatism, bilateral: Secondary | ICD-10-CM | POA: Diagnosis not present

## 2022-03-30 DIAGNOSIS — H04123 Dry eye syndrome of bilateral lacrimal glands: Secondary | ICD-10-CM | POA: Diagnosis not present

## 2022-03-30 DIAGNOSIS — H43813 Vitreous degeneration, bilateral: Secondary | ICD-10-CM | POA: Diagnosis not present

## 2022-03-30 DIAGNOSIS — H524 Presbyopia: Secondary | ICD-10-CM | POA: Diagnosis not present

## 2022-03-30 DIAGNOSIS — H31003 Unspecified chorioretinal scars, bilateral: Secondary | ICD-10-CM | POA: Diagnosis not present

## 2022-03-30 DIAGNOSIS — H401131 Primary open-angle glaucoma, bilateral, mild stage: Secondary | ICD-10-CM | POA: Diagnosis not present

## 2022-03-30 DIAGNOSIS — H35373 Puckering of macula, bilateral: Secondary | ICD-10-CM | POA: Diagnosis not present

## 2022-07-08 DIAGNOSIS — Z1211 Encounter for screening for malignant neoplasm of colon: Secondary | ICD-10-CM | POA: Diagnosis not present

## 2022-07-08 DIAGNOSIS — E785 Hyperlipidemia, unspecified: Secondary | ICD-10-CM | POA: Diagnosis not present

## 2022-07-08 DIAGNOSIS — I10 Essential (primary) hypertension: Secondary | ICD-10-CM | POA: Diagnosis not present

## 2022-08-27 ENCOUNTER — Encounter: Payer: Self-pay | Admitting: Gastroenterology

## 2022-08-31 IMAGING — MG MM DIGITAL SCREENING BILAT W/ TOMO AND CAD
8 series · 9 of 24 positions shown · non-contrast
Comparison: Previous exam(s).

CLINICAL DATA: Screening.

EXAM:
DIGITAL SCREENING BILATERAL MAMMOGRAM WITH TOMOSYNTHESIS AND CAD
TECHNIQUE: Bilateral screening digital craniocaudal and mediolateral oblique
mammograms were obtained. Bilateral screening digital breast
tomosynthesis was performed. The images were evaluated with
computer-aided detection.

[R MLO synth-2D]
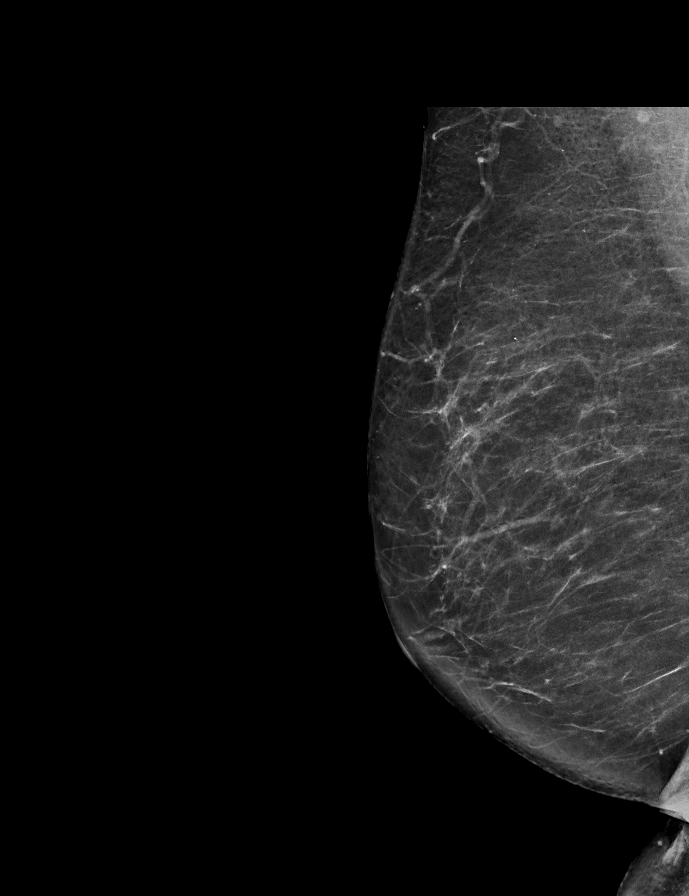

[L MLO synth-2D]
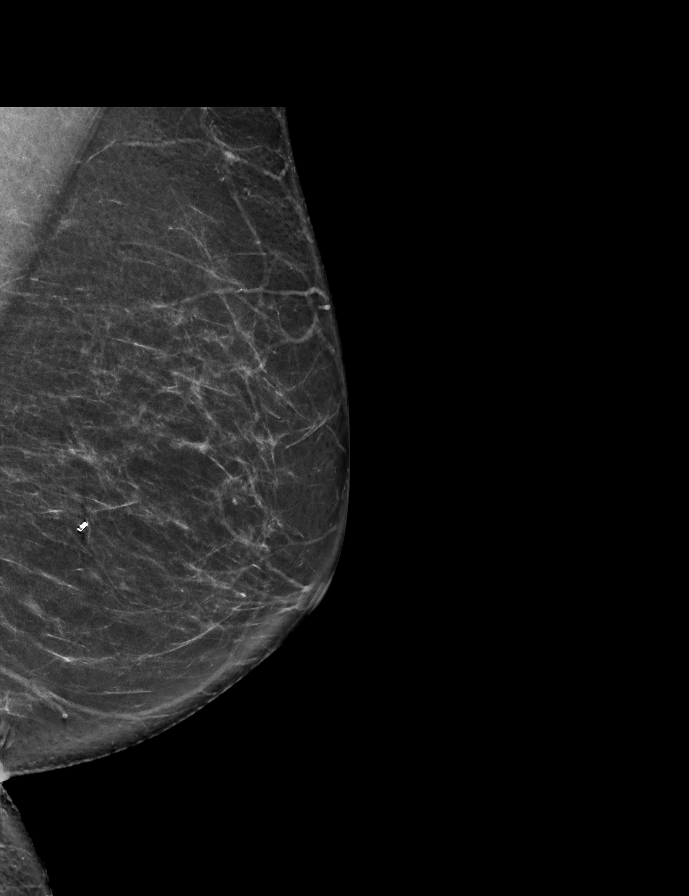

[R CC synth-2D]
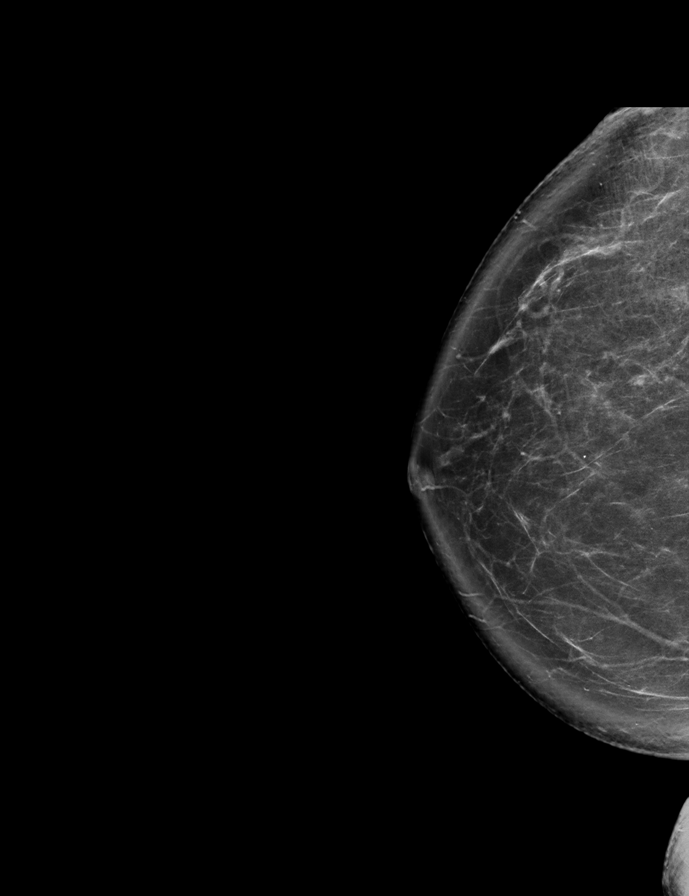

[L CC synth-2D]
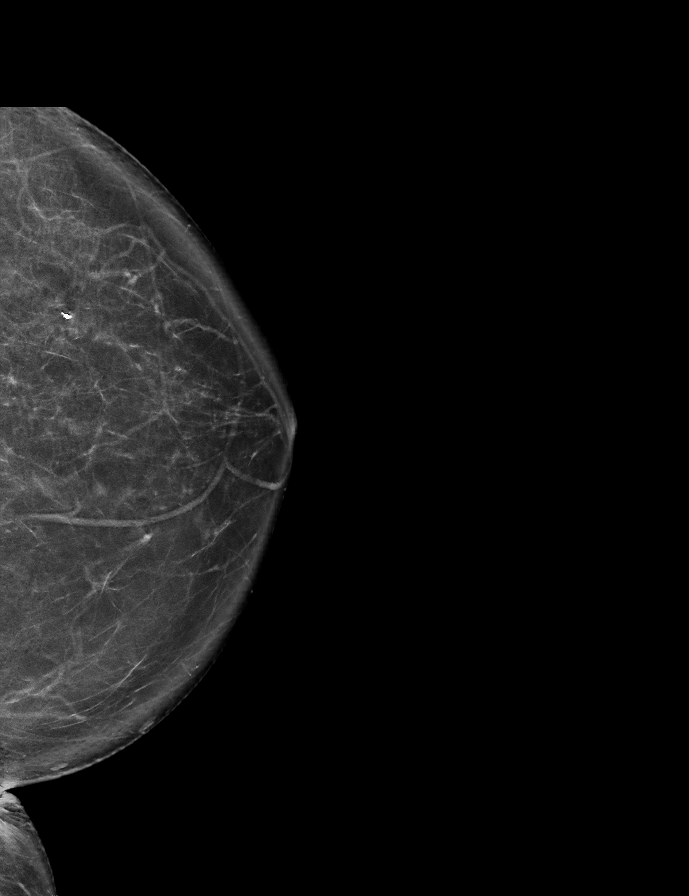

[R CC tomo · 2 of 84 frames shown]
[frame 28/84]
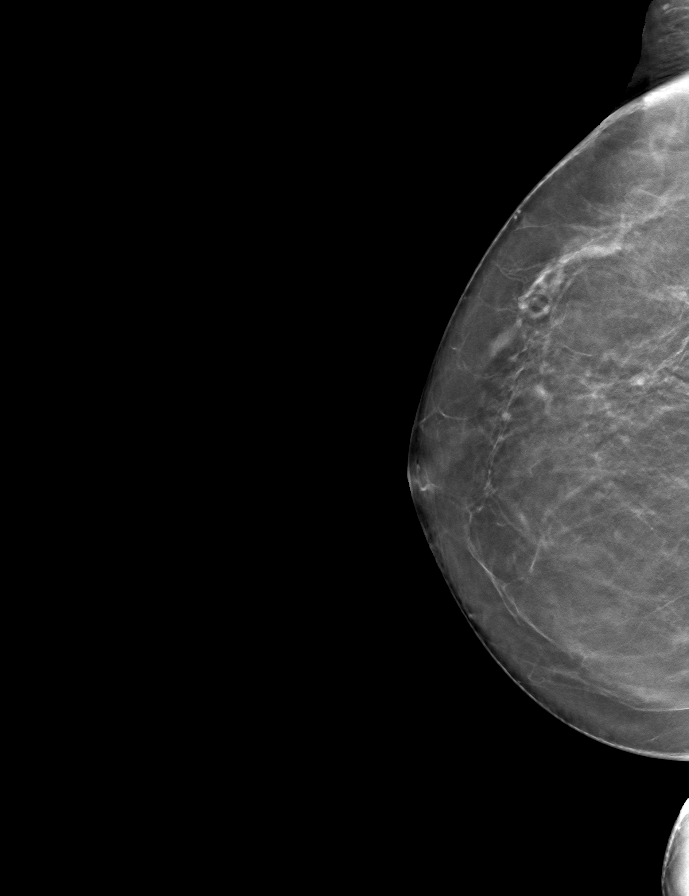
[frame 43/84]
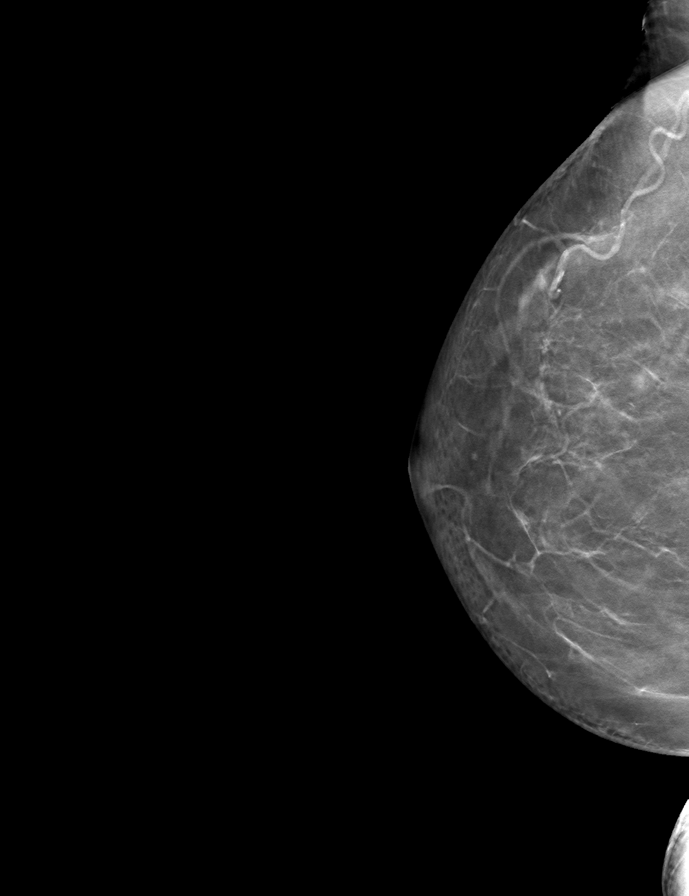

[L CC tomo · tomo slice 39/76.0]
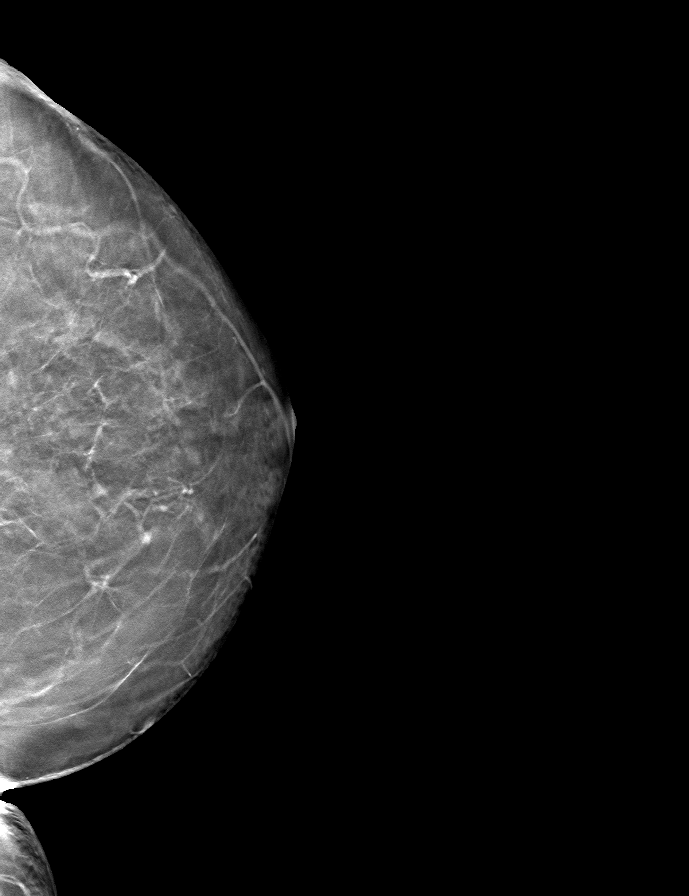

[L MLO tomo · tomo slice 37/74.0]
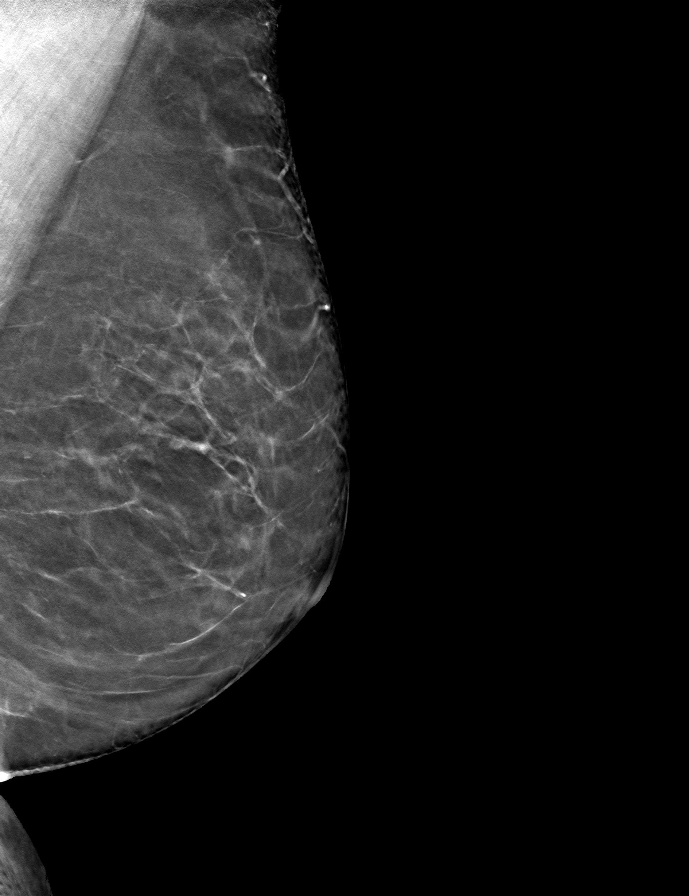

[R MLO tomo · tomo slice 37/73.0]
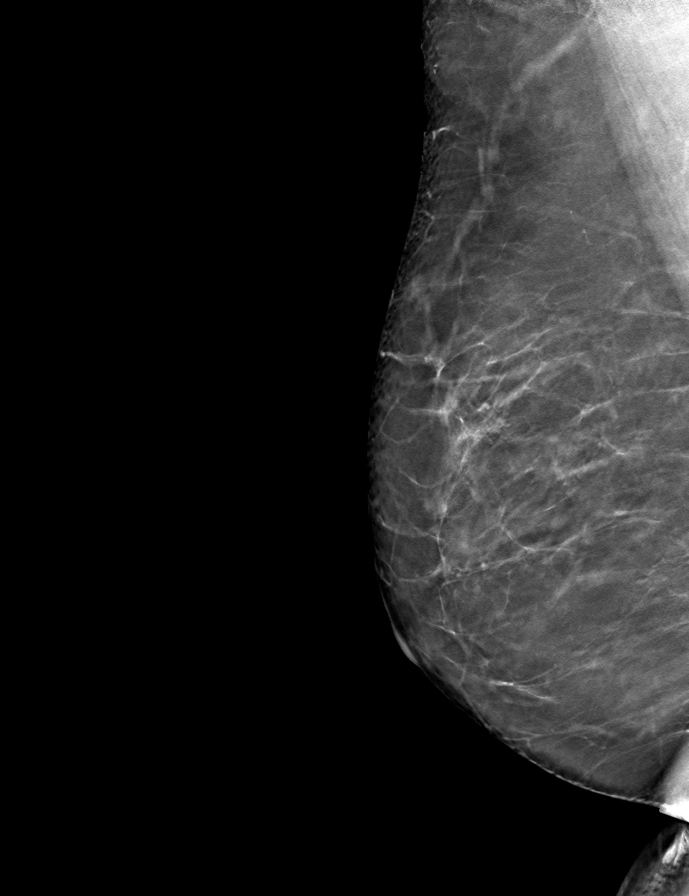

[9 of 24 positions shown; findings below may reference images not displayed]

ACR Breast Density Category b: There are scattered areas of
fibroglandular density.
FINDINGS: There are no findings suspicious for malignancy.
IMPRESSION: No mammographic evidence of malignancy. A result letter of this
screening mammogram will be mailed directly to the patient.

RECOMMENDATION:
Screening mammogram in one year. (Code:51-O-LD2)

BI-RADS CATEGORY  1: Negative.

## 2022-09-22 DIAGNOSIS — Z1283 Encounter for screening for malignant neoplasm of skin: Secondary | ICD-10-CM | POA: Diagnosis not present

## 2022-09-22 DIAGNOSIS — D485 Neoplasm of uncertain behavior of skin: Secondary | ICD-10-CM | POA: Diagnosis not present

## 2022-09-22 DIAGNOSIS — L570 Actinic keratosis: Secondary | ICD-10-CM | POA: Diagnosis not present

## 2022-10-06 DIAGNOSIS — H43811 Vitreous degeneration, right eye: Secondary | ICD-10-CM | POA: Diagnosis not present

## 2022-10-06 DIAGNOSIS — H31001 Unspecified chorioretinal scars, right eye: Secondary | ICD-10-CM | POA: Diagnosis not present

## 2022-10-06 DIAGNOSIS — H401131 Primary open-angle glaucoma, bilateral, mild stage: Secondary | ICD-10-CM | POA: Diagnosis not present

## 2022-10-30 ENCOUNTER — Ambulatory Visit: Payer: Medicare PPO | Admitting: Gastroenterology

## 2022-10-30 ENCOUNTER — Encounter: Payer: Self-pay | Admitting: Gastroenterology

## 2022-10-30 VITALS — BP 140/80 | HR 88 | Ht 63.0 in | Wt 143.5 lb

## 2022-10-30 DIAGNOSIS — K649 Unspecified hemorrhoids: Secondary | ICD-10-CM

## 2022-10-30 DIAGNOSIS — Z1211 Encounter for screening for malignant neoplasm of colon: Secondary | ICD-10-CM

## 2022-10-30 NOTE — Patient Instructions (Signed)
Follow up as needed.   If your blood pressure at your visit was 140/90 or greater, please contact your primary care physician to follow up on this.  _______________________________________________________  If you are age 75 or older, your body mass index should be between 23-30. Your Body mass index is 25.42 kg/m. If this is out of the aforementioned range listed, please consider follow up with your Primary Care Provider.  If you are age 41 or younger, your body mass index should be between 19-25. Your Body mass index is 25.42 kg/m. If this is out of the aformentioned range listed, please consider follow up with your Primary Care Provider.   ________________________________________________________  The [facility] providers would like to encourage you to use [facility] to communicate with providers for non-urgent requests or questions.  Due to long hold times on the telephone, sending your provider a message by [facility] may be a faster and more efficient way to get a response.  Please allow 48 business hours for a response.  Please remember that this is for non-urgent requests.  _______________________________________________________  Thank you for choosing me and [facility].   Thank you for choosing me and [facility].

## 2022-10-30 NOTE — Progress Notes (Unsigned)
Chief Complaint: Colonoscopy recall Primary GI MD: [name]  HPI: 77 year old female with no significant past medical history presents for evaluation of recall colonoscopy.  She reports her last colonoscopy was 06/2012 with Dr. [name].  With a 10-year recall.  Patient states she denies any GI issues today.  She does states she has a longstanding history of hemorrhoids dating back to her first pregnancy in the 54s.  States she will have occasional bleeding associated with her hemorrhoids and has had banding in the past with Dr. [name].  She states her hemorrhoids would occasionally act up if she had ibuprofen or ate something that did not agree with her.  This is currently not an issue for her.  She also reports a remote history of RUQ pain that occurred twice while eating barbecue.  Her PCP told her it was her gallbladder.  She has not had a recurrence since avoiding fatty/greasy foods.  Denies family history of colon cancer.  Denies weight loss.  Denies change in bowel habits.  Overall healthy diet without significant medical history.  Though, she does report she was told she had IBS in the 70s and was told she had "colitis".  She states that she took a medicine for this for 8 years and then when she saw Dr. [name] her colonoscopy told her she did not have colitis.  Past Medical History:  Diagnosis Date   Anemia    Anxiety    Arthritis    Cataract    Depression    Glaucoma    HLD (hyperlipidemia)    HTN (hypertension)    Hypothyroidism    IBS (irritable bowel syndrome)    Pneumonia     Past Surgical History:  Procedure Laterality Date   APPENDECTOMY     BREAST BIOPSY Left    benign   CATARACT EXTRACTION, BILATERAL  2017   RETINAL DETACHMENT SURGERY Right 2017   TONSILLECTOMY     age 78   TUBAL LIGATION  1979    Current Outpatient Medications  Medication Sig Dispense Refill   acetaminophen (TYLENOL) 500 MG tablet Take 500-1,000 mg by mouth as needed.     calcium  carbonate (TUMS EX) 750 MG chewable tablet Chew 1 tablet by mouth as needed for heartburn.     Cholecalciferol (VITAMIN D3) 50 MCG (2000 UT) capsule Take 2,000 Units by mouth daily.     diphenhydrAMINE (BENADRYL) 25 MG tablet Take 25 mg by mouth as needed.     escitalopram (LEXAPRO) 10 MG tablet Take 10 mg by mouth daily.     ibuprofen (ADVIL) 200 MG tablet Take 200-600 mg by mouth as needed.     latanoprost (XALATAN) 0.005 % ophthalmic solution Place 1 drop into both eyes at bedtime.     levothyroxine (SYNTHROID) 50 MCG tablet Take 1 tablet by mouth daily.     losartan (COZAAR) 50 MG tablet Take 1 tablet by mouth daily.     Omega-3 Fatty Acids (ULTRA OMEGA-3 FISH OIL) 1400 MG CAPS Take 1 capsule by mouth daily.     Polyvinyl Alcohol-Povidone (REFRESH OP) Place 1 drop into both eyes as needed.     Zinc 30 MG TABS Take 1 tablet by mouth as needed.     No current facility-administered medications for this visit.    Allergies as of 10/30/2022 - Review Complete 10/30/2022  Allergen Reaction Noted   Amoxicillin-pot clavulanate Diarrhea and Nausea And Vomiting 04/10/2010   Zoster vaccine live  04/22/2020    Family History  Problem Relation Age of Onset   Diabetes Mother    Heart disease Mother    Prostate cancer Father    Heart attack Father    Thyroid disease Sister    Hypertension Sister    Heart disease Maternal Grandmother    Emphysema Paternal Grandmother    Retinal detachment Son     Social History   Socioeconomic History   Marital status: Widowed    Spouse name: Not on file   Number of children: 2   Years of education: Not on file   Highest education level: Not on file  Occupational History   Occupation: retired  Tobacco Use   Smoking status: Former    Current packs/day: 0.00    Types: Cigarettes    Quit date: 2012    Years since quitting: 12.6   Smokeless tobacco: Never  Vaping Use   Vaping status: Never Used  Substance and Sexual Activity   Alcohol use: Yes     Comment: occasional wine   Drug use: Never   Sexual activity: Not on file  Other Topics Concern   Not on file  Social History Narrative   Not on file   Social Determinants of Health   Financial Resource Strain: Not on file  Food Insecurity: Not on file  Transportation Needs: Not on file  Physical Activity: Not on file  Stress: Not on file  Social Connections: Not on file  Intimate Partner Violence: Not on file    Review of Systems:    Constitutional: No weight loss, fever, chills, weakness or fatigue HEENT: Eyes: No change in vision               Ears, Nose, Throat:  No change in hearing or congestion Skin: No rash or itching Cardiovascular: No chest pain, chest pressure or palpitations   Respiratory: No SOB or cough Gastrointestinal: See HPI and otherwise negative Genitourinary: No dysuria or change in urinary frequency Neurological: No headache, dizziness or syncope Musculoskeletal: No new muscle or joint pain Hematologic: No bleeding or bruising Psychiatric: No history of depression or anxiety    Physical Exam:  Vital signs: BP (!) 140/80 (BP Location: Left Arm, Patient Position: Sitting, Cuff Size: Normal)   Pulse 88   Ht 5\' 3"  (1.6 m) Comment: height measured without shoes  Wt 65.1 kg   BMI 25.42 kg/m   Constitutional: NAD, Well developed, Well nourished, alert and cooperative.  Appears significantly younger than stated age. Head:  Normocephalic and atraumatic. Eyes:   PEERL, EOMI. No icterus. Conjunctiva pink. Respiratory: Respirations even and unlabored. Lungs clear to auscultation bilaterally.   No wheezes, crackles, or rhonchi.  Cardiovascular:  Regular rate and rhythm. No peripheral edema, cyanosis or pallor.  Gastrointestinal:  Soft, nondistended, nontender. No rebound or guarding. Normal bowel sounds. No appreciable masses or hepatomegaly. Rectal:  Not performed.  Msk:  Symmetrical without gross deformities. Without edema, no deformity or joint  abnormality.  Neurologic:  Alert and  oriented x4;  grossly normal neurologically.  Skin:   Dry and intact without significant lesions or rashes. Psychiatric: Oriented to person, place and time. Demonstrates good judgement and reason without abnormal affect or behaviors.   RELEVANT LABS AND IMAGING: CBC No results found for: "WBC", "RBC", "HGB", "HCT", "PLT", "MCV", "MCH", "MCHC", "RDW", "LYMPHSABS", "MONOABS", "EOSABS", "BASOSABS"  CMP     Component Value Date/Time   NA 140 04/09/2010 2250   K 4.3 04/09/2010 2250   CL 102 04/09/2010 2250   CO2 29  04/09/2010 2250   GLUCOSE 117 (H) 04/09/2010 2250   BUN 9 04/09/2010 2250   CREATININE 0.70 04/09/2010 2250   CALCIUM 9.5 04/09/2010 2250   GFRNONAA >60 04/09/2010 2250   GFRAA  04/09/2010 2250    >60        The eGFR has been calculated using the MDRD equation. This calculation has not been validated in all clinical situations. eGFR's persistently <60 mL/min signify possible Chronic Kidney Disease.     Assessment/Plan:   Screening for colon cancer Due for screening colonoscopy.  Patient appears much younger than stated age and does not have significant medical history.  I think she is an adequate candidate for repeat colonoscopy.  She requests Dr. Adela Lank --- Schedule colonoscopy  --- I thoroughly discussed the procedure with the patient (at bedside) to include nature of the procedure, alternatives, benefits, and risks (including but not limited to bleeding, infection, perforation, anesthesia/cardiac pulmonary complications).  Patient verbalized understanding and gave verbal consent to proceed with procedure.   RUQ pain Discussed by 2 episodes of RUQ pain after eating barbecue with patient and if she is interested in further workup including labs and ultrasound.  Patient states this has not occurred in quite some time and is not interested in further workup.  She will let us know if pain returns and at that time we can consider  RUQ ultrasound for evaluation of gallstones  Hemorrhoids History of band ligation and intermittent rectal bleeding associated with hemorrhoids.  Not an issue for her at this time. --- Discussed importance of increased fiber, increase water, increase exercise as well as prevention of constipation to prevent worsening hemorrhoids.  Lara Mulch [facility] 10/30/2022, 2:45 PM  Cc: Geoffry Paradise, MD

## 2022-11-02 ENCOUNTER — Telehealth: Payer: Self-pay | Admitting: *Deleted

## 2022-11-02 ENCOUNTER — Encounter: Payer: Self-pay | Admitting: Gastroenterology

## 2022-11-02 DIAGNOSIS — Z1211 Encounter for screening for malignant neoplasm of colon: Secondary | ICD-10-CM

## 2022-11-02 MED ORDER — NA SULFATE-K SULFATE-MG SULF 17.5-3.13-1.6 GM/177ML PO SOLN: ORAL | 0 refills | Status: DC

## 2022-11-02 NOTE — Telephone Encounter (Signed)
Patient would like for Dr Adela Lank to complete colonoscopy (See office note dated 10/30/22). She has been scheduled for [facility] colonoscopy on 12/15/22 at 230 pm, 130 pm arrival. She is advised that she will need a care partner, 43+ years old to bring her, stay for procedure and take her home due to sedation. Patient verbalizes understanding. Instructions for prep have been mailed to patient's home address at her request and she is asked to contact us to let us know she has received instructions and ask any questions. She verbalizes understanding of this as well.

## 2022-11-02 NOTE — Progress Notes (Signed)
Agree with assessment and plan as outlined.  

## 2022-12-02 ENCOUNTER — Encounter: Payer: Self-pay | Admitting: Gastroenterology

## 2022-12-11 NOTE — Telephone Encounter (Signed)
PT is calling to have instructions updated to mychart. The instructions she has have the correct date but the days of the week is wrong. Please advise.

## 2022-12-11 NOTE — Telephone Encounter (Signed)
Spoke to patient and reassured her that all dates and times for procedure are correct on previous prep instructions. Advised that the "days" of the procedure were off. These "days" were corrected and a new set of instructions made available in mychart should patient feel the need to use them. She verbalizes understanding.

## 2022-12-15 ENCOUNTER — Ambulatory Visit: Payer: Medicare PPO | Admitting: Gastroenterology

## 2022-12-15 ENCOUNTER — Encounter: Payer: Self-pay | Admitting: Gastroenterology

## 2022-12-15 VITALS — BP 140/71 | HR 66 | Temp 97.5°F | Resp 16 | Ht 63.0 in | Wt 139.0 lb

## 2022-12-15 DIAGNOSIS — K635 Polyp of colon: Secondary | ICD-10-CM | POA: Diagnosis not present

## 2022-12-15 DIAGNOSIS — D123 Benign neoplasm of transverse colon: Secondary | ICD-10-CM | POA: Diagnosis not present

## 2022-12-15 DIAGNOSIS — Z09 Encounter for follow-up examination after completed treatment for conditions other than malignant neoplasm: Secondary | ICD-10-CM | POA: Diagnosis not present

## 2022-12-15 DIAGNOSIS — D127 Benign neoplasm of rectosigmoid junction: Secondary | ICD-10-CM | POA: Diagnosis not present

## 2022-12-15 DIAGNOSIS — D124 Benign neoplasm of descending colon: Secondary | ICD-10-CM | POA: Diagnosis not present

## 2022-12-15 DIAGNOSIS — F32A Depression, unspecified: Secondary | ICD-10-CM | POA: Diagnosis not present

## 2022-12-15 DIAGNOSIS — I10 Essential (primary) hypertension: Secondary | ICD-10-CM | POA: Diagnosis not present

## 2022-12-15 DIAGNOSIS — Z860100 Personal history of colon polyps, unspecified: Secondary | ICD-10-CM | POA: Diagnosis not present

## 2022-12-15 DIAGNOSIS — D125 Benign neoplasm of sigmoid colon: Secondary | ICD-10-CM | POA: Diagnosis not present

## 2022-12-15 DIAGNOSIS — D122 Benign neoplasm of ascending colon: Secondary | ICD-10-CM

## 2022-12-15 DIAGNOSIS — F419 Anxiety disorder, unspecified: Secondary | ICD-10-CM | POA: Diagnosis not present

## 2022-12-15 MED ORDER — SODIUM CHLORIDE 0.9 % IV SOLN: 500.0000 mL | Freq: Once | INTRAVENOUS | Status: DC

## 2022-12-15 NOTE — Progress Notes (Signed)
Pt's states no medical or surgical changes since previsit or office visit. 

## 2022-12-15 NOTE — Op Note (Signed)
[facility] Patient Name: Shoshannah Faubert Procedure Date: 12/15/2022 2:03 PM MRN: [id number] Endoscopist: Viviann Spare P. Adela Lank , MD, [id number] Age: 77 Referring MD:  Date of Birth: 1945/05/10 Gender: Female Account #: [id number] Procedure:                Colonoscopy Indications:              High risk colon cancer surveillance: Personal                            history of colonic polyps - last exam Dr. [name] -                            she reports having 2 polyps removed during that exam Medicines:                Monitored Anesthesia Care Procedure:                Pre-Anesthesia Assessment:                           - Prior to the procedure, a History and Physical                            was performed, and patient medications and                            allergies were reviewed. The patient's tolerance of                            previous anesthesia was also reviewed. The risks                            and benefits of the procedure and the sedation                            options and risks were discussed with the patient.                            All questions were answered, and informed consent                            was obtained. Prior Anticoagulants: The patient has                            taken no anticoagulant or antiplatelet agents. ASA                            Grade Assessment: II - A patient with mild systemic                            disease. After reviewing the risks and benefits,                            the patient was deemed in satisfactory condition to  undergo the procedure.                           After obtaining informed consent, the colonoscope                            was passed under direct vision. Throughout the                            procedure, the patient's blood pressure, pulse, and                            oxygen saturations were monitored continuously. The                             PCF-HQ190L Colonoscope [id number] was introduced                            through the anus and advanced to the the cecum,                            identified by appendiceal orifice and ileocecal                            valve. The colonoscopy was performed without                            difficulty. The patient tolerated the procedure                            well. The quality of the bowel preparation was                            adequate. The ileocecal valve, appendiceal orifice,                            and rectum were photographed. Scope In: 2:17:45 PM Scope Out: 2:40:04 PM Scope Withdrawal Time: 0 hours 16 minutes 40 seconds  Total Procedure Duration: 0 hours 22 minutes 19 seconds  Findings:                 Hemorrhoids were found on perianal exam.                           A 15 to 20 mm polyp was found in the ascending                            colon. The polyp was flat. The polyp was removed                            with a cold snare. Resection and retrieval were                            complete.  A 10 mm polyp was found in the transverse colon.                            The polyp was flat. The polyp was removed with a                            cold snare. Resection and retrieval were complete.                           A 4 mm polyp was found in the descending colon. The                            polyp was sessile. The polyp was removed with a                            cold snare. Resection and retrieval were complete.                           Two sessile polyps were found in the sigmoid colon.                            The polyps were 4 mm in size. These polyps were                            removed with a cold snare. Resection and retrieval                            were complete.                           A 4 mm polyp was found in the recto-sigmoid colon.                            The polyp was sessile. The polyp was removed  with a                            cold snare. Resection and retrieval were complete.                           Multiple small-mouthed diverticula were found in                            the sigmoid colon.                           Internal hemorrhoids were found during retroflexion.                           The exam was otherwise without abnormality. Complications:            No immediate complications. Estimated blood loss:  Minimal. Estimated Blood Loss:     Estimated blood loss was minimal. Impression:               - Hemorrhoids found on perianal exam.                           - One 15 to 20 mm polyp in the ascending colon,                            removed with a cold snare. Resected and retrieved.                           - One 10 mm polyp in the transverse colon, removed                            with a cold snare. Resected and retrieved.                           - One 4 mm polyp in the descending colon, removed                            with a cold snare. Resected and retrieved.                           - Two 4 mm polyps in the sigmoid colon, removed                            with a cold snare. Resected and retrieved.                           - One 4 mm polyp at the recto-sigmoid colon,                            removed with a cold snare. Resected and retrieved.                           - Diverticulosis in the sigmoid colon.                           - Internal hemorrhoids.                           - The examination was otherwise normal. Recommendation:           - Patient has a contact number available for                            emergencies. The signs and symptoms of potential                            delayed complications were discussed with the                            patient. Return to normal activities tomorrow.  Written discharge instructions were provided to the                            patient.                            - Resume previous diet.                           - Continue present medications.                           - Await pathology results. Viviann Spare P. Evgenia Merriman, MD 12/15/2022 2:47:29 PM This report has been signed electronically.

## 2022-12-15 NOTE — Patient Instructions (Signed)
Please read handouts provided. Continue present medications. Await pathology results.   YOU HAD AN ENDOSCOPIC PROCEDURE TODAY AT THE [facility]:   Refer to the procedure report that was given to you for any specific questions about what was found during the examination.  If the procedure report does not answer your questions, please call your gastroenterologist to clarify.  If you requested that your care partner not be given the details of your procedure findings, then the procedure report has been included in a sealed envelope for you to review at your convenience later.  YOU SHOULD EXPECT: Some feelings of bloating in the abdomen. Passage of more gas than usual.  Walking can help get rid of the air that was put into your GI tract during the procedure and reduce the bloating. If you had a lower endoscopy (such as a colonoscopy or flexible sigmoidoscopy) you may notice spotting of blood in your stool or on the toilet paper. If you underwent a bowel prep for your procedure, you may not have a normal bowel movement for a few days.  Please Note:  You might notice some irritation and congestion in your nose or some drainage.  This is from the oxygen used during your procedure.  There is no need for concern and it should clear up in a day or so.  SYMPTOMS TO REPORT IMMEDIATELY:  Following lower endoscopy (colonoscopy or flexible sigmoidoscopy):  Excessive amounts of blood in the stool  Significant tenderness or worsening of abdominal pains  Swelling of the abdomen that is new, acute  Fever of 100F or higher  For urgent or emergent issues, a gastroenterologist can be reached at any hour by calling [phone]. Do not use MyChart messaging for urgent concerns.    DIET:  We do recommend a small meal at first, but then you may proceed to your regular diet.  Drink plenty of fluids but you should avoid alcoholic beverages for 24 hours.  ACTIVITY:  You should plan to take it easy for  the rest of today and you should NOT DRIVE or use heavy machinery until tomorrow (because of the sedation medicines used during the test).    FOLLOW UP: Our staff will call the number listed on your records the next business day following your procedure.  We will call around 7:15- 8:00 am to check on you and address any questions or concerns that you may have regarding the information given to you following your procedure. If we do not reach you, we will leave a message.     If any biopsies were taken you will be contacted by phone or by letter within the next 1-3 weeks.  Please call us at [phone] if you have not heard about the biopsies in 3 weeks.    SIGNATURES/CONFIDENTIALITY: You and/or your care partner have signed paperwork which will be entered into your electronic medical record.  These signatures attest to the fact that that the information above on your After Visit Summary has been reviewed and is understood.  Full responsibility of the confidentiality of this discharge information lies with you and/or your care-partner. 

## 2022-12-15 NOTE — Progress Notes (Signed)
[facility] History and Physical   Primary Care Physician:  Geoffry Paradise, MD   Reason for Procedure:   History of colon polyps  Plan:    colonoscopy     HPI: Sarah Baldwin is a 77 y.o. female  here for colonoscopy surveillane - last exam 06/2012 - Dr. [name]. She reports she had 2 polyps removed at the time.  Patient denies any bowel symptoms at this time. Does endorse symptomatic hemorrhoids. No family history of colon cancer known. Otherwise feels well without any cardiopulmonary symptoms.   I have discussed risks / benefits of anesthesia and endoscopic procedure with [facility] and they wish to proceed with the exams as outlined today.    Past Medical History:  Diagnosis Date   Anemia    Anxiety    Arthritis    Cataract    Depression    GERD (gastroesophageal reflux disease)    Glaucoma    HLD (hyperlipidemia)    HTN (hypertension)    Hypothyroidism    IBS (irritable bowel syndrome)    Pneumonia     Past Surgical History:  Procedure Laterality Date   APPENDECTOMY     BREAST BIOPSY Left    benign   CATARACT EXTRACTION, BILATERAL  2017   RETINAL DETACHMENT SURGERY Right 2017   TONSILLECTOMY     age 64   TUBAL LIGATION  1979    Prior to Admission medications   Medication Sig Start Date End Date Taking? Authorizing Provider  Cholecalciferol (VITAMIN D3) 50 MCG (2000 UT) capsule Take 2,000 Units by mouth daily.   Yes [provider]  escitalopram (LEXAPRO) 10 MG tablet Take 10 mg by mouth daily. 07/10/19  Yes [provider]  latanoprost (XALATAN) 0.005 % ophthalmic solution Place 1 drop into both eyes at bedtime. 12/22/19  Yes [provider]  levothyroxine (SYNTHROID) 50 MCG tablet Take 1 tablet by mouth daily. 02/15/16  Yes [provider]  losartan (COZAAR) 50 MG tablet Take 1 tablet by mouth daily. 04/10/16  Yes [provider]  Omega-3 Fatty Acids (ULTRA OMEGA-3 FISH OIL) 1400 MG  CAPS Take 1 capsule by mouth daily.   Yes [provider]  Polyvinyl Alcohol-Povidone (REFRESH OP) Place 1 drop into both eyes as needed.   Yes [provider]  acetaminophen (TYLENOL) 500 MG tablet Take 500-1,000 mg by mouth as needed.    [provider]  calcium carbonate (TUMS EX) 750 MG chewable tablet Chew 1 tablet by mouth as needed for heartburn.    [provider]  diphenhydrAMINE (BENADRYL) 25 MG tablet Take 25 mg by mouth as needed.    [provider]  ibuprofen (ADVIL) 200 MG tablet Take 200-600 mg by mouth as needed.    [provider]  Zinc 30 MG TABS Take 1 tablet by mouth as needed. Patient not taking: Reported on 12/15/2022    [provider]    Current Outpatient Medications  Medication Sig Dispense Refill   Cholecalciferol (VITAMIN D3) 50 MCG (2000 UT) capsule Take 2,000 Units by mouth daily.     escitalopram (LEXAPRO) 10 MG tablet Take 10 mg by mouth daily.     latanoprost (XALATAN) 0.005 % ophthalmic solution Place 1 drop into both eyes at bedtime.     levothyroxine (SYNTHROID) 50 MCG tablet Take 1 tablet by mouth daily.     losartan (COZAAR) 50 MG tablet Take 1 tablet by mouth daily.     Omega-3 Fatty Acids (ULTRA OMEGA-3  FISH OIL) 1400 MG CAPS Take 1 capsule by mouth daily.     Polyvinyl Alcohol-Povidone (REFRESH OP) Place 1 drop into both eyes as needed.     acetaminophen (TYLENOL) 500 MG tablet Take 500-1,000 mg by mouth as needed.     calcium carbonate (TUMS EX) 750 MG chewable tablet Chew 1 tablet by mouth as needed for heartburn.     diphenhydrAMINE (BENADRYL) 25 MG tablet Take 25 mg by mouth as needed.     ibuprofen (ADVIL) 200 MG tablet Take 200-600 mg by mouth as needed.     Zinc 30 MG TABS Take 1 tablet by mouth as needed. (Patient not taking: Reported on 12/15/2022)     Current Facility-Administered Medications  Medication Dose Route Frequency Provider Last Rate Last Admin   0.9 %  sodium chloride  infusion  500 mL Intravenous Once Zariana Strub, Willaim Rayas, MD        Allergies as of 12/15/2022 - Review Complete 12/15/2022  Allergen Reaction Noted   Amoxicillin-pot clavulanate Diarrhea and Nausea And Vomiting 04/10/2010   Zoster vaccine live  04/22/2020    Family History  Problem Relation Age of Onset   Diabetes Mother    Heart disease Mother    Prostate cancer Father    Heart attack Father    Thyroid disease Sister    Hypertension Sister    Heart disease Maternal Grandmother    Emphysema Paternal Grandmother    Retinal detachment Son     Social History   Socioeconomic History   Marital status: Widowed    Spouse name: Not on file   Number of children: 2   Years of education: Not on file   Highest education level: Not on file  Occupational History   Occupation: retired  Tobacco Use   Smoking status: Former    Current packs/day: 0.00    Types: Cigarettes    Quit date: 2012    Years since quitting: 12.7   Smokeless tobacco: Never  Vaping Use   Vaping status: Never Used  Substance and Sexual Activity   Alcohol use: Yes    Comment: occasional wine   Drug use: Never   Sexual activity: Not on file  Other Topics Concern   Not on file  Social History Narrative   Not on file   Social Determinants of Health   Financial Resource Strain: Not on file  Food Insecurity: Not on file  Transportation Needs: Not on file  Physical Activity: Not on file  Stress: Not on file  Social Connections: Not on file  Intimate Partner Violence: Not on file    Review of Systems: All other review of systems negative except as mentioned in the HPI.  Physical Exam: Vital signs BP (!) 146/95   Pulse 69   Temp (!) 97.5 F (36.4 C) (Skin)   Ht 5\' 3"  (1.6 m)   Wt 139 lb (63 kg)   SpO2 96%   BMI 24.62 kg/m   General:   Alert,  Well-developed, pleasant and cooperative in NAD Lungs:  Clear throughout to auscultation.   Heart:  Regular rate and rhythm Abdomen:  Soft, nontender and  nondistended.   Neuro/Psych:  Alert and cooperative. Normal mood and affect. A and O x 3  Harlin Rain, MD [facility]

## 2022-12-15 NOTE — Progress Notes (Signed)
Sedate, gd SR, tolerated procedure well, VSS, report to RN 

## 2022-12-15 NOTE — Progress Notes (Signed)
Called to room to assist during endoscopic procedure.  Patient ID and intended procedure confirmed with present staff. Received instructions for my participation in the procedure from the performing physician.  

## 2022-12-16 ENCOUNTER — Telehealth: Payer: Self-pay

## 2022-12-16 NOTE — Telephone Encounter (Signed)
Left message on answering machine. 

## 2022-12-18 LAB — SURGICAL PATHOLOGY

## 2022-12-21 ENCOUNTER — Telehealth: Payer: Self-pay | Admitting: Gastroenterology

## 2022-12-21 NOTE — Telephone Encounter (Signed)
Inbound call from patient, states she would like procedure results mailed to her due to her not having access to MyChart. Patient would also like a nurse to discuss results once they are reviewed.

## 2022-12-21 NOTE — Telephone Encounter (Signed)
Letter will be mailed to patient with results once reviewed by Dr Adela Lank.

## 2022-12-22 ENCOUNTER — Encounter: Payer: Self-pay | Admitting: Gastroenterology

## 2022-12-30 DIAGNOSIS — Z0001 Encounter for general adult medical examination with abnormal findings: Secondary | ICD-10-CM | POA: Diagnosis not present

## 2022-12-30 DIAGNOSIS — I10 Essential (primary) hypertension: Secondary | ICD-10-CM | POA: Diagnosis not present

## 2022-12-30 DIAGNOSIS — E039 Hypothyroidism, unspecified: Secondary | ICD-10-CM | POA: Diagnosis not present

## 2022-12-30 DIAGNOSIS — Z1389 Encounter for screening for other disorder: Secondary | ICD-10-CM | POA: Diagnosis not present

## 2022-12-30 DIAGNOSIS — Z1212 Encounter for screening for malignant neoplasm of rectum: Secondary | ICD-10-CM | POA: Diagnosis not present

## 2022-12-30 DIAGNOSIS — E785 Hyperlipidemia, unspecified: Secondary | ICD-10-CM | POA: Diagnosis not present

## 2023-01-06 DIAGNOSIS — I10 Essential (primary) hypertension: Secondary | ICD-10-CM | POA: Diagnosis not present

## 2023-01-06 DIAGNOSIS — Z1331 Encounter for screening for depression: Secondary | ICD-10-CM | POA: Diagnosis not present

## 2023-01-06 DIAGNOSIS — Z1339 Encounter for screening examination for other mental health and behavioral disorders: Secondary | ICD-10-CM | POA: Diagnosis not present

## 2023-01-06 DIAGNOSIS — K219 Gastro-esophageal reflux disease without esophagitis: Secondary | ICD-10-CM | POA: Diagnosis not present

## 2023-01-06 DIAGNOSIS — K648 Other hemorrhoids: Secondary | ICD-10-CM | POA: Diagnosis not present

## 2023-01-06 DIAGNOSIS — R82998 Other abnormal findings in urine: Secondary | ICD-10-CM | POA: Diagnosis not present

## 2023-01-06 DIAGNOSIS — E785 Hyperlipidemia, unspecified: Secondary | ICD-10-CM | POA: Diagnosis not present

## 2023-01-06 DIAGNOSIS — M1711 Unilateral primary osteoarthritis, right knee: Secondary | ICD-10-CM | POA: Diagnosis not present

## 2023-01-06 DIAGNOSIS — M8580 Other specified disorders of bone density and structure, unspecified site: Secondary | ICD-10-CM | POA: Diagnosis not present

## 2023-01-06 DIAGNOSIS — E039 Hypothyroidism, unspecified: Secondary | ICD-10-CM | POA: Diagnosis not present

## 2023-01-06 DIAGNOSIS — Z0000 Encounter for general adult medical examination without abnormal findings: Secondary | ICD-10-CM | POA: Diagnosis not present

## 2023-01-06 DIAGNOSIS — T466X5A Adverse effect of antihyperlipidemic and antiarteriosclerotic drugs, initial encounter: Secondary | ICD-10-CM | POA: Diagnosis not present

## 2023-01-15 ENCOUNTER — Other Ambulatory Visit: Payer: Self-pay | Admitting: Internal Medicine

## 2023-01-15 DIAGNOSIS — Z1231 Encounter for screening mammogram for malignant neoplasm of breast: Secondary | ICD-10-CM

## 2023-01-20 ENCOUNTER — Ambulatory Visit
Admission: RE | Admit: 2023-01-20 | Discharge: 2023-01-20 | Disposition: A | Discharge status: Home / Self Care | Payer: Medicare PPO | Source: Ambulatory Visit | Attending: Internal Medicine | Admitting: Internal Medicine

## 2023-01-20 DIAGNOSIS — Z1231 Encounter for screening mammogram for malignant neoplasm of breast: Secondary | ICD-10-CM | POA: Diagnosis not present

## 2023-04-07 DIAGNOSIS — H52203 Unspecified astigmatism, bilateral: Secondary | ICD-10-CM | POA: Diagnosis not present

## 2023-04-07 DIAGNOSIS — H401131 Primary open-angle glaucoma, bilateral, mild stage: Secondary | ICD-10-CM | POA: Diagnosis not present

## 2023-04-07 DIAGNOSIS — H35373 Puckering of macula, bilateral: Secondary | ICD-10-CM | POA: Diagnosis not present

## 2023-04-07 DIAGNOSIS — H04123 Dry eye syndrome of bilateral lacrimal glands: Secondary | ICD-10-CM | POA: Diagnosis not present

## 2023-04-07 DIAGNOSIS — Z79899 Other long term (current) drug therapy: Secondary | ICD-10-CM | POA: Diagnosis not present

## 2023-04-07 DIAGNOSIS — H524 Presbyopia: Secondary | ICD-10-CM | POA: Diagnosis not present

## 2023-04-07 DIAGNOSIS — H43813 Vitreous degeneration, bilateral: Secondary | ICD-10-CM | POA: Diagnosis not present

## 2023-04-14 ENCOUNTER — Other Ambulatory Visit (HOSPITAL_COMMUNITY): Payer: Self-pay | Admitting: Internal Medicine

## 2023-04-14 DIAGNOSIS — E039 Hypothyroidism, unspecified: Secondary | ICD-10-CM | POA: Diagnosis not present

## 2023-04-14 DIAGNOSIS — R1010 Upper abdominal pain, unspecified: Secondary | ICD-10-CM | POA: Diagnosis not present

## 2023-04-14 DIAGNOSIS — I10 Essential (primary) hypertension: Secondary | ICD-10-CM | POA: Diagnosis not present

## 2023-04-14 DIAGNOSIS — H349 Unspecified retinal vascular occlusion: Secondary | ICD-10-CM | POA: Diagnosis not present

## 2023-04-14 DIAGNOSIS — R52 Pain, unspecified: Secondary | ICD-10-CM

## 2023-04-14 DIAGNOSIS — E785 Hyperlipidemia, unspecified: Secondary | ICD-10-CM | POA: Diagnosis not present

## 2023-04-14 DIAGNOSIS — R109 Unspecified abdominal pain: Secondary | ICD-10-CM

## 2023-04-15 ENCOUNTER — Other Ambulatory Visit (HOSPITAL_COMMUNITY): Payer: Self-pay | Admitting: Internal Medicine

## 2023-04-15 DIAGNOSIS — H349 Unspecified retinal vascular occlusion: Secondary | ICD-10-CM

## 2023-04-23 ENCOUNTER — Ambulatory Visit (HOSPITAL_COMMUNITY)
Admission: RE | Admit: 2023-04-23 | Discharge: 2023-04-23 | Disposition: A | Discharge status: Home / Self Care | Payer: Medicare PPO | Source: Ambulatory Visit | Attending: Internal Medicine | Admitting: Internal Medicine

## 2023-04-23 DIAGNOSIS — R52 Pain, unspecified: Secondary | ICD-10-CM | POA: Diagnosis not present

## 2023-04-23 DIAGNOSIS — K838 Other specified diseases of biliary tract: Secondary | ICD-10-CM | POA: Diagnosis not present

## 2023-04-23 DIAGNOSIS — R109 Unspecified abdominal pain: Secondary | ICD-10-CM | POA: Insufficient documentation

## 2023-04-23 DIAGNOSIS — K219 Gastro-esophageal reflux disease without esophagitis: Secondary | ICD-10-CM | POA: Diagnosis not present

## 2023-04-23 DIAGNOSIS — K7689 Other specified diseases of liver: Secondary | ICD-10-CM | POA: Diagnosis not present

## 2023-04-23 DIAGNOSIS — K589 Irritable bowel syndrome without diarrhea: Secondary | ICD-10-CM | POA: Diagnosis not present

## 2023-04-30 ENCOUNTER — Ambulatory Visit (HOSPITAL_COMMUNITY): Payer: Medicare PPO

## 2023-05-03 ENCOUNTER — Ambulatory Visit (HOSPITAL_COMMUNITY): Payer: Medicare PPO

## 2023-05-05 DIAGNOSIS — I10 Essential (primary) hypertension: Secondary | ICD-10-CM | POA: Diagnosis not present

## 2023-05-05 DIAGNOSIS — E785 Hyperlipidemia, unspecified: Secondary | ICD-10-CM | POA: Diagnosis not present

## 2023-05-05 DIAGNOSIS — K760 Fatty (change of) liver, not elsewhere classified: Secondary | ICD-10-CM | POA: Diagnosis not present

## 2023-05-06 DIAGNOSIS — H401131 Primary open-angle glaucoma, bilateral, mild stage: Secondary | ICD-10-CM | POA: Diagnosis not present

## 2023-05-06 DIAGNOSIS — Z79899 Other long term (current) drug therapy: Secondary | ICD-10-CM | POA: Diagnosis not present

## 2023-05-12 ENCOUNTER — Ambulatory Visit (HOSPITAL_COMMUNITY)
Admission: RE | Admit: 2023-05-12 | Discharge: 2023-05-12 | Disposition: A | Discharge status: Home / Self Care | Payer: Medicare PPO | Source: Ambulatory Visit | Attending: Vascular Surgery | Admitting: Vascular Surgery

## 2023-05-12 ENCOUNTER — Other Ambulatory Visit (HOSPITAL_COMMUNITY): Payer: Self-pay | Admitting: Internal Medicine

## 2023-05-12 DIAGNOSIS — H349 Unspecified retinal vascular occlusion: Secondary | ICD-10-CM | POA: Insufficient documentation

## 2023-05-13 ENCOUNTER — Ambulatory Visit (HOSPITAL_COMMUNITY)
Admission: RE | Admit: 2023-05-13 | Discharge: 2023-05-13 | Disposition: A | Discharge status: Home / Self Care | Payer: Medicare PPO | Source: Ambulatory Visit | Attending: Internal Medicine | Admitting: Internal Medicine

## 2023-05-13 DIAGNOSIS — H349 Unspecified retinal vascular occlusion: Secondary | ICD-10-CM | POA: Diagnosis not present

## 2023-05-13 LAB — ECHOCARDIOGRAM COMPLETE
AR max vel: 2.35 cm2
AV Area VTI: 2.7 cm2
AV Area mean vel: 2.38 cm2
AV Mean grad: 6 mmHg
AV Peak grad: 11.2 mmHg
Ao pk vel: 1.67 m/s
Area-P 1/2: 2.37 cm2
Est EF: >75
S' Lateral: 1.5 cm

## 2023-05-13 NOTE — Progress Notes (Signed)
*  PRELIMINARY RESULTS* Echocardiogram 2D Echocardiogram has been performed.  Stacey Drain 05/13/2023, 3:57 PM

## 2023-05-20 DIAGNOSIS — M791 Myalgia, unspecified site: Secondary | ICD-10-CM | POA: Diagnosis not present

## 2023-05-20 DIAGNOSIS — E785 Hyperlipidemia, unspecified: Secondary | ICD-10-CM | POA: Diagnosis not present

## 2023-05-20 DIAGNOSIS — T466X5A Adverse effect of antihyperlipidemic and antiarteriosclerotic drugs, initial encounter: Secondary | ICD-10-CM | POA: Diagnosis not present

## 2023-05-20 DIAGNOSIS — K760 Fatty (change of) liver, not elsewhere classified: Secondary | ICD-10-CM | POA: Diagnosis not present

## 2023-05-20 DIAGNOSIS — I10 Essential (primary) hypertension: Secondary | ICD-10-CM | POA: Diagnosis not present

## 2023-06-30 DIAGNOSIS — E785 Hyperlipidemia, unspecified: Secondary | ICD-10-CM | POA: Diagnosis not present

## 2023-06-30 DIAGNOSIS — I10 Essential (primary) hypertension: Secondary | ICD-10-CM | POA: Diagnosis not present

## 2023-06-30 DIAGNOSIS — J302 Other seasonal allergic rhinitis: Secondary | ICD-10-CM | POA: Diagnosis not present

## 2023-06-30 DIAGNOSIS — E039 Hypothyroidism, unspecified: Secondary | ICD-10-CM | POA: Diagnosis not present

## 2023-06-30 DIAGNOSIS — K760 Fatty (change of) liver, not elsewhere classified: Secondary | ICD-10-CM | POA: Diagnosis not present

## 2023-09-08 DIAGNOSIS — H401131 Primary open-angle glaucoma, bilateral, mild stage: Secondary | ICD-10-CM | POA: Diagnosis not present

## 2023-09-21 DIAGNOSIS — L821 Other seborrheic keratosis: Secondary | ICD-10-CM | POA: Diagnosis not present

## 2023-09-21 DIAGNOSIS — L814 Other melanin hyperpigmentation: Secondary | ICD-10-CM | POA: Diagnosis not present

## 2023-09-21 DIAGNOSIS — D1801 Hemangioma of skin and subcutaneous tissue: Secondary | ICD-10-CM | POA: Diagnosis not present

## 2023-09-21 DIAGNOSIS — L570 Actinic keratosis: Secondary | ICD-10-CM | POA: Diagnosis not present

## 2024-01-05 DIAGNOSIS — E785 Hyperlipidemia, unspecified: Secondary | ICD-10-CM | POA: Diagnosis not present

## 2024-01-05 DIAGNOSIS — M8580 Other specified disorders of bone density and structure, unspecified site: Secondary | ICD-10-CM | POA: Diagnosis not present

## 2024-01-05 DIAGNOSIS — I10 Essential (primary) hypertension: Secondary | ICD-10-CM | POA: Diagnosis not present

## 2024-01-05 DIAGNOSIS — E039 Hypothyroidism, unspecified: Secondary | ICD-10-CM | POA: Diagnosis not present

## 2024-01-05 DIAGNOSIS — Z1212 Encounter for screening for malignant neoplasm of rectum: Secondary | ICD-10-CM | POA: Diagnosis not present

## 2024-01-12 ENCOUNTER — Other Ambulatory Visit (HOSPITAL_COMMUNITY): Payer: Self-pay | Admitting: Internal Medicine

## 2024-01-12 DIAGNOSIS — E039 Hypothyroidism, unspecified: Secondary | ICD-10-CM | POA: Diagnosis not present

## 2024-01-12 DIAGNOSIS — E785 Hyperlipidemia, unspecified: Secondary | ICD-10-CM | POA: Diagnosis not present

## 2024-01-12 DIAGNOSIS — I10 Essential (primary) hypertension: Secondary | ICD-10-CM | POA: Diagnosis not present

## 2024-01-12 DIAGNOSIS — R109 Unspecified abdominal pain: Secondary | ICD-10-CM

## 2024-01-12 DIAGNOSIS — K760 Fatty (change of) liver, not elsewhere classified: Secondary | ICD-10-CM | POA: Diagnosis not present

## 2024-01-12 DIAGNOSIS — F32A Depression, unspecified: Secondary | ICD-10-CM | POA: Diagnosis not present

## 2024-01-12 DIAGNOSIS — R82998 Other abnormal findings in urine: Secondary | ICD-10-CM | POA: Diagnosis not present

## 2024-01-12 DIAGNOSIS — Z0000 Encounter for general adult medical examination without abnormal findings: Secondary | ICD-10-CM | POA: Diagnosis not present

## 2024-01-12 DIAGNOSIS — Z1339 Encounter for screening examination for other mental health and behavioral disorders: Secondary | ICD-10-CM | POA: Diagnosis not present

## 2024-01-12 DIAGNOSIS — Z1331 Encounter for screening for depression: Secondary | ICD-10-CM | POA: Diagnosis not present

## 2024-01-19 DIAGNOSIS — H401131 Primary open-angle glaucoma, bilateral, mild stage: Secondary | ICD-10-CM | POA: Diagnosis not present

## 2024-01-19 DIAGNOSIS — H04123 Dry eye syndrome of bilateral lacrimal glands: Secondary | ICD-10-CM | POA: Diagnosis not present

## 2024-01-19 DIAGNOSIS — H52203 Unspecified astigmatism, bilateral: Secondary | ICD-10-CM | POA: Diagnosis not present

## 2024-01-19 DIAGNOSIS — H524 Presbyopia: Secondary | ICD-10-CM | POA: Diagnosis not present

## 2024-01-19 DIAGNOSIS — H35373 Puckering of macula, bilateral: Secondary | ICD-10-CM | POA: Diagnosis not present

## 2024-03-14 ENCOUNTER — Ambulatory Visit (HOSPITAL_COMMUNITY)
Admission: RE | Admit: 2024-03-14 | Discharge: 2024-03-14 | Disposition: A | Discharge status: Home / Self Care | Source: Ambulatory Visit | Attending: Internal Medicine | Admitting: Internal Medicine

## 2024-03-14 DIAGNOSIS — R109 Unspecified abdominal pain: Secondary | ICD-10-CM | POA: Insufficient documentation

## 2024-03-14 MED ORDER — IOHEXOL 300 MG/ML  SOLN
100.0000 mL | Freq: Once | INTRAMUSCULAR | Status: AC | PRN
  Administered 2024-03-14: 100 mL via INTRAVENOUS
